# Patient Record
Sex: Male | Born: 1950
Health system: Southern US, Community
[De-identification: ages and names within clinical notes are randomized; demographics above are authoritative.]

## PROBLEM LIST (undated history)

## (undated) HISTORY — PX: HERNIA REPAIR: SHX51

## (undated) HISTORY — PX: CYST EXCISION: SHX5701

---

## 2010-10-02 ENCOUNTER — Encounter
Admission: RE | Admit: 2010-10-02 | Discharge: 2010-10-02 | Payer: Self-pay | Source: Home / Self Care | Attending: Gastroenterology | Admitting: Gastroenterology

## 2012-07-26 IMAGING — RF DG BE W/ AIR HIGH DENSITY
7 of 24 series · 7 of 24 positions shown · IV contrast (agent unspecified)
Comparison: None.

CLINICAL DATA: Incomplete colonoscopy

AIR-CONTRAST BARIUM ENEMA
TECHNIQUE: After obtaining a scout radiograph air-contrast barium
enema was performed under fluoroscopy using high-density barium.
Fluoroscopy Time: 2.6-minute
Contrast: Double contrast barium enema

[Series 1: run · 1 of 1 slices shown (1 of 7)]
[im 1/1]
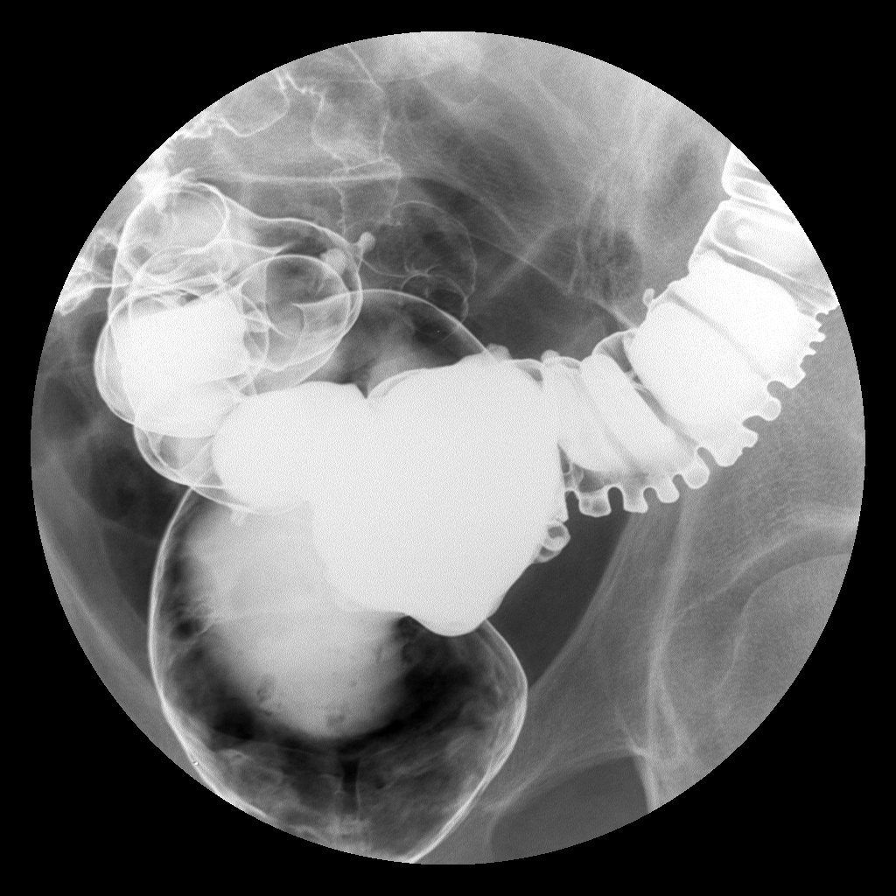

[Series 2: run · 1 of 1 slices shown (2 of 7)]
[im 1/1]
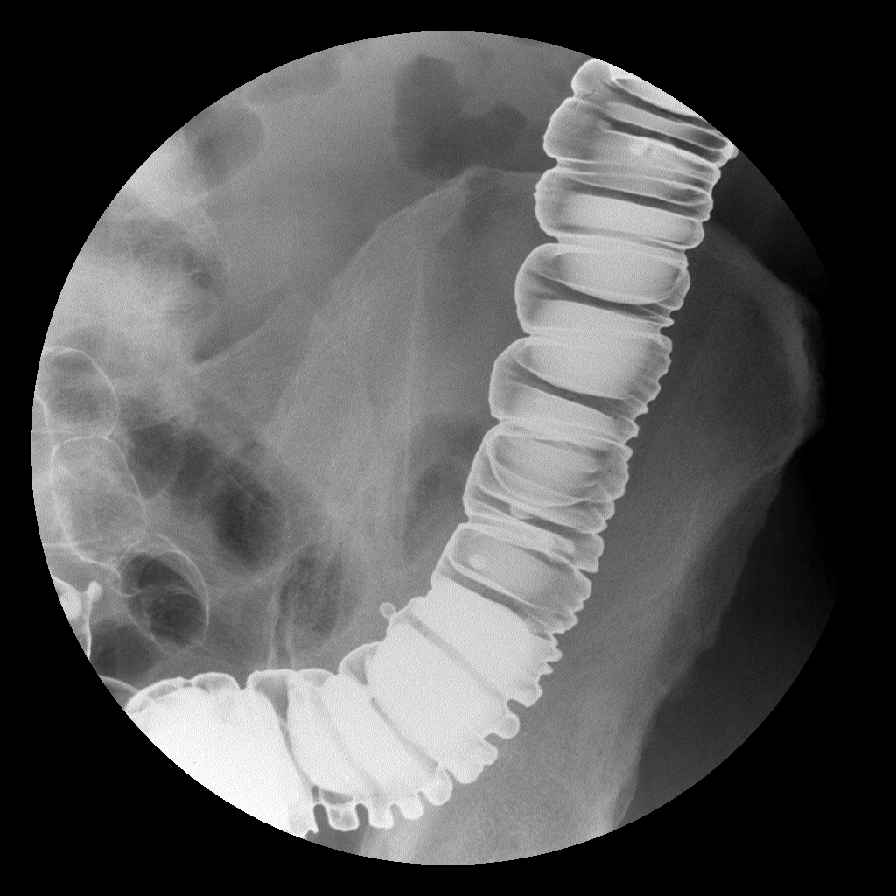

[Series 3: run · 1 of 1 slices shown (3 of 7)]
[im 1/1]
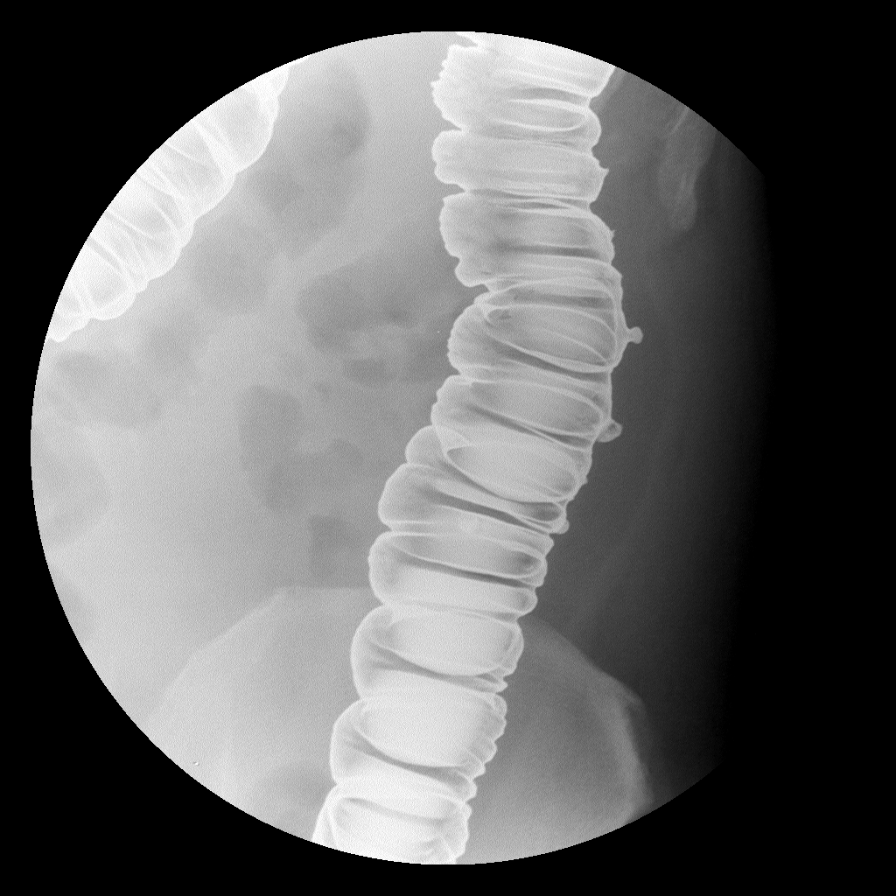

[Series 4: run · 1 of 1 slices shown (4 of 7)]
[im 1/1]
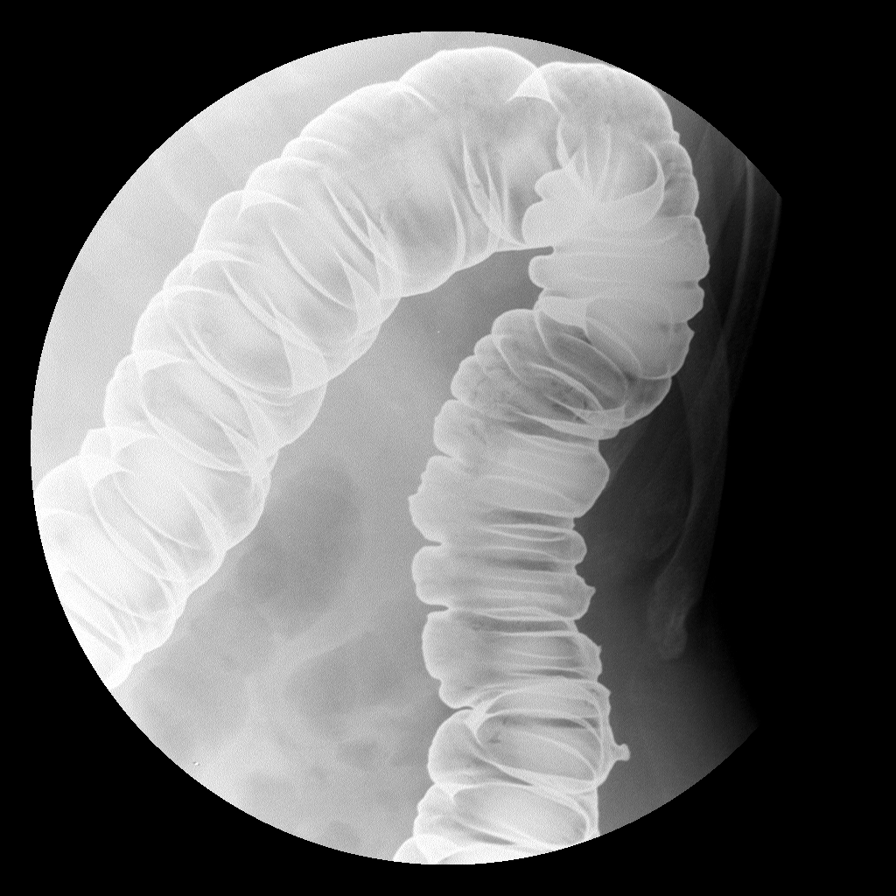

[Series 5: run · 1 of 1 slices shown (5 of 7)]
[im 1/1]
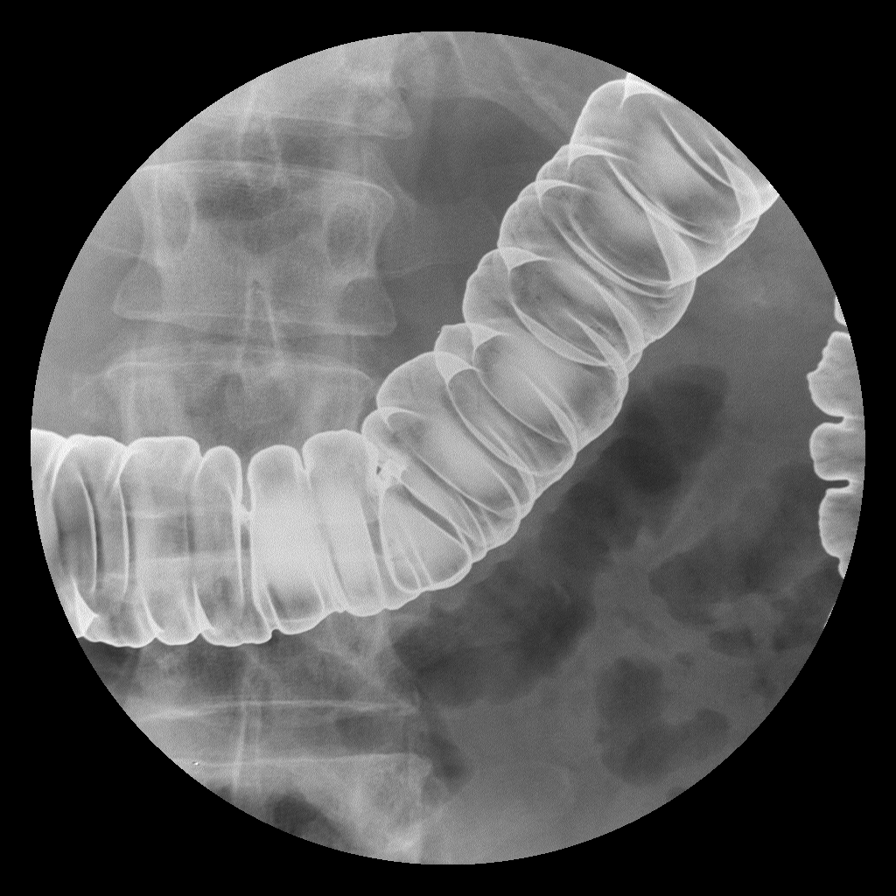

[Series 6: run · 1 of 1 slices shown (6 of 7)]
[im 1/1]
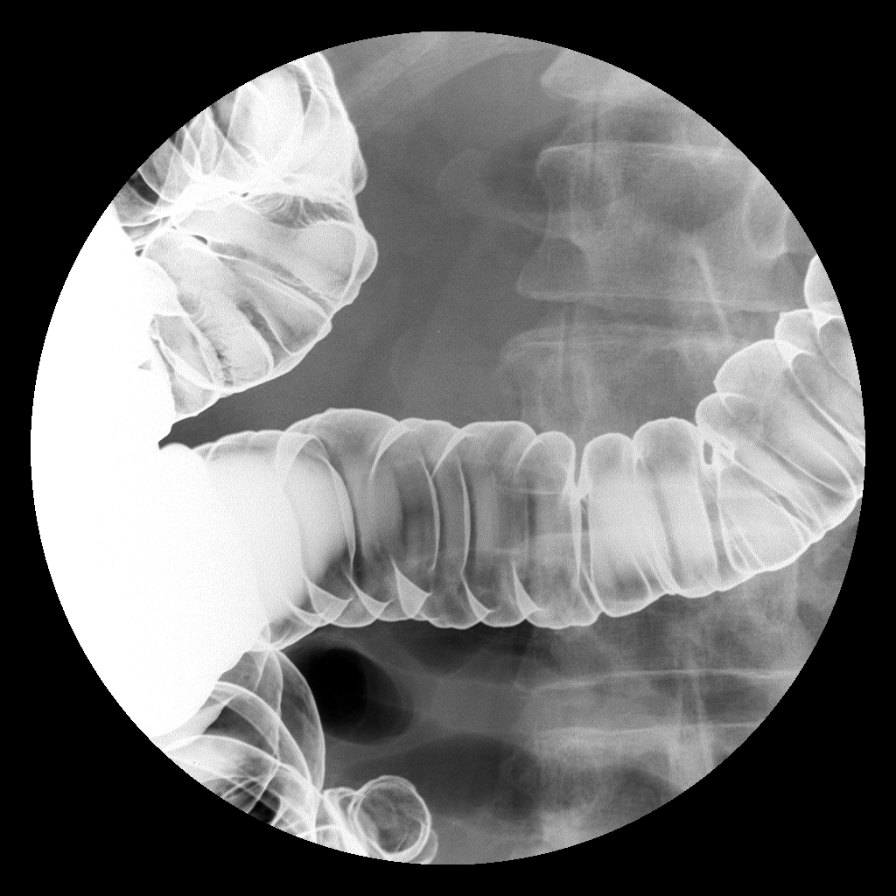

[Series 7: run · 1 of 1 slices shown (7 of 7)]
[im 1/1]
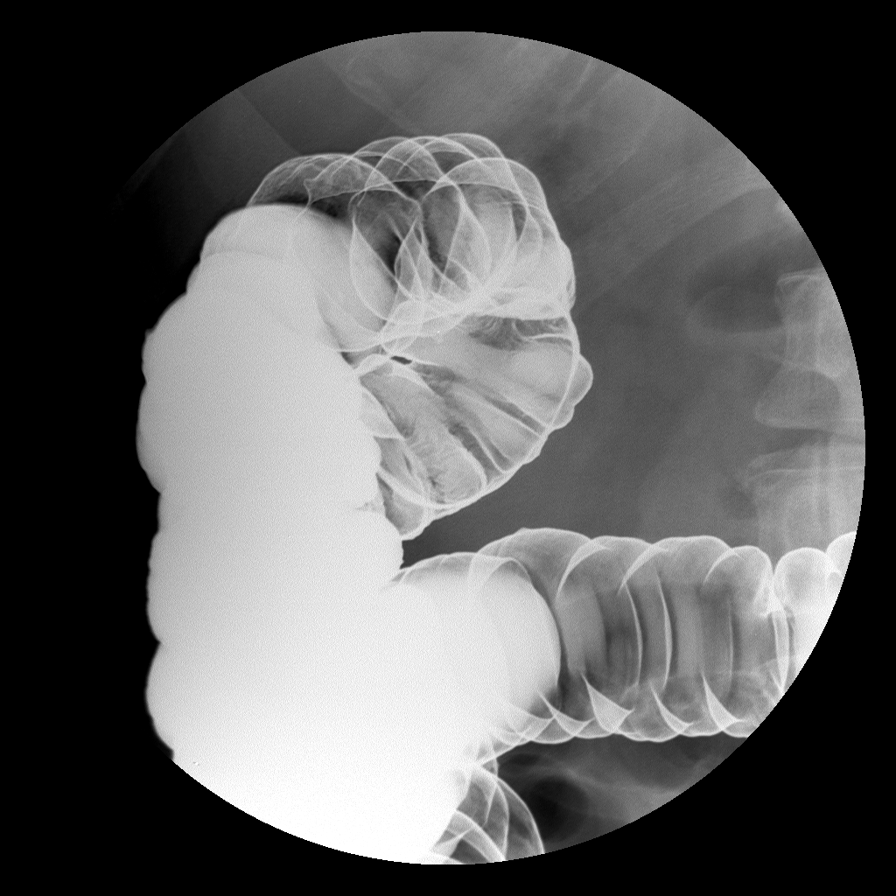

[7 of 24 positions shown; findings below may reference images not displayed]

FINDINGS: A preliminary film of the abdomen shows a nonspecific
bowel gas pattern.  There are degenerative changes in the lumbar
spine.  No opaque calculi are seen.

A double contrast barium enema was performed.  There are
diverticula primarily within the rectosigmoid and distal descending
colon.  A few small diverticula are present in the splenic flexure
of colon as well.  The cecum and terminal ileum appear normal.
IMPRESSION: 1.  Diverticula primarily in the descending and rectosigmoid colon.
2.  No persistent polypoid lesion or constricting lesion.
3.  The terminal ileum appears normal.

## 2013-05-21 ENCOUNTER — Ambulatory Visit (INDEPENDENT_AMBULATORY_CARE_PROVIDER_SITE_OTHER): Payer: PRIVATE HEALTH INSURANCE | Admitting: Internal Medicine

## 2013-05-21 VITALS — BP 102/58 | HR 95 | Temp 98.1°F | Resp 18 | Ht 69.5 in | Wt 197.0 lb

## 2013-05-21 DIAGNOSIS — R52 Pain, unspecified: Secondary | ICD-10-CM

## 2013-05-21 DIAGNOSIS — R6883 Chills (without fever): Secondary | ICD-10-CM

## 2013-05-21 DIAGNOSIS — R319 Hematuria, unspecified: Secondary | ICD-10-CM

## 2013-05-21 LAB — POCT CBC
HCT, POC: 50.7 % (ref 43.5–53.7)
Hemoglobin: 16.9 g/dL (ref 14.1–18.1)
MCH, POC: 32.2 pg — AB (ref 27–31.2)
MCHC: 33.3 g/dL (ref 31.8–35.4)
MPV: 8.7 fL (ref 0–99.8)
POC Granulocyte: 6.8 (ref 2–6.9)
POC MID %: 3.5 %M (ref 0–12)
RDW, POC: 14 %
WBC: 8.2 10*3/uL (ref 4.6–10.2)

## 2013-05-21 LAB — POCT UA - MICROSCOPIC ONLY
Casts, Ur, LPF, POC: NEGATIVE
Crystals, Ur, HPF, POC: NEGATIVE
Mucus, UA: NEGATIVE
Yeast, UA: NEGATIVE

## 2013-05-21 LAB — POCT URINALYSIS DIPSTICK
Leukocytes, UA: NEGATIVE
Spec Grav, UA: 1.015

## 2013-05-21 MED ORDER — DOXYCYCLINE HYCLATE 100 MG PO TABS: 100.0000 mg | ORAL_TABLET | Freq: Two times a day (BID) | ORAL | Status: DC

## 2013-05-21 MED ORDER — CEFTRIAXONE SODIUM 1 G IJ SOLR
1.0000 g | Freq: Once | INTRAMUSCULAR | Status: AC
  Administered 2013-05-21: 1 g via INTRAMUSCULAR

## 2013-05-21 NOTE — Progress Notes (Signed)
Subjective:    Patient ID: Adger Cantera, male    DOB: 02/09/1951, 62 y.o.   MRN: [id number]  HPI CO chills and body aches, no other sxs. Had tick bite 2 weeks ago. No rash or stiff neck, no HA. No urinary sxs. On no meds, no alcohol , drug, or tobacco use. MD/FP is in [facility] in blanket, chilled  Review of Systems Neg.    Objective:   Physical Exam  Vitals reviewed. Constitutional: He is oriented to person, place, and time. He appears well-developed and well-nourished. No distress.  HENT:  Right Ear: External ear normal.  Left Ear: External ear normal.  Nose: Nose normal.  Mouth/Throat: Oropharynx is clear and moist.  Eyes: EOM are normal. Pupils are equal, round, and reactive to light. No scleral icterus.  Neck: Normal range of motion. Neck supple. No thyromegaly present.  Cardiovascular: Normal rate, regular rhythm and normal heart sounds.   Pulmonary/Chest: Effort normal and breath sounds normal.  Abdominal: Soft. Normal appearance. Bowel sounds are decreased. There is no hepatosplenomegaly. There is tenderness. There is CVA tenderness. There is no rebound and no guarding. Hernia confirmed negative in the right inguinal area and confirmed negative in the left inguinal area.    Right flank mild tenderness  Genitourinary: Rectum normal, prostate normal, testes normal and penis normal. No penile erythema or penile tenderness.  Prostate boggy 2+ symmetric, not tender, no nodules  Musculoskeletal: Normal range of motion.  Lymphadenopathy:    He has no cervical adenopathy.       Right: No inguinal adenopathy present.       Left: No inguinal adenopathy present.  Neurological: He is alert and oriented to person, place, and time. He exhibits normal muscle tone.  Skin: No rash noted.  Psychiatric: He has a normal mood and affect. His behavior is normal.   RMSF titer  Results for orders placed in visit on 05/21/13  POCT CBC      Result Value Range   WBC 8.2  4.6 -  10.2 K/uL   Lymph, poc 1.1  0.6 - 3.4   POC LYMPH PERCENT 13.6  10 - 50 %L   MID (cbc) 0.3  0 - 0.9   POC MID % 3.5  0 - 12 %M   POC Granulocyte 6.8  2 - 6.9   Granulocyte percent 82.9 (*) 37 - 80 %G   RBC 5.25  4.69 - 6.13 M/uL   Hemoglobin 16.9  14.1 - 18.1 g/dL   HCT, POC 78.2  95.6 - 53.7 %   MCV 96.6  80 - 97 fL   MCH, POC 32.2 (*) 27 - 31.2 pg   MCHC 33.3  31.8 - 35.4 g/dL   RDW, POC 21.3     Platelet Count, POC 203  142 - 424 K/uL   MPV 8.7  0 - 99.8 fL  POCT URINALYSIS DIPSTICK      Result Value Range   Color, UA yellow     Clarity, UA clear     Glucose, UA neg     Bilirubin, UA neg     Ketones, UA neg     Spec Grav, UA 1.015     Blood, UA mod     pH, UA 6.5     Protein, UA neg     Urobilinogen, UA 0.2     Nitrite, UA neg     Leukocytes, UA Negative    POCT UA - MICROSCOPIC ONLY  Result Value Range   WBC, Ur, HPF, POC 0-3     RBC, urine, microscopic 6-17     Bacteria, U Microscopic trace     Mucus, UA neg     Epithelial cells, urine per micros 0-2     Crystals, Ur, HPF, POC neg     Casts, Ur, LPF, POC neg     Yeast, UA neg     1 po doxy 100mg  now     Assessment & Plan:  Doxy bid/Rocephin 1g RTC tomorrow

## 2013-05-21 NOTE — Progress Notes (Signed)
  Subjective:    Patient ID: Dennis Byrd, male    DOB: 1951-07-12, 62 y.o.   MRN: [id number]  HPIPatient presents with chills and body aches starting last night.    Review of Systems     Objective:   Physical Exam        Assessment & Plan:

## 2013-05-21 NOTE — Patient Instructions (Addendum)
Fever, Adult A fever is a higher than normal body temperature. In an adult, an oral temperature around 98.6 F (37 C) is considered normal. A temperature of 100.4 F (38 C) or higher is generally considered a fever. Mild or moderate fevers generally have no long-term effects and often do not require treatment. Extreme fever (greater than or equal to 106 F or 41.1 C) can cause seizures. The sweating that may occur with repeated or prolonged fever may cause dehydration. Elderly people can develop confusion during a fever. A measured temperature can vary with:  Age.  Time of day.  Method of measurement (mouth, underarm, rectal, or ear). The fever is confirmed by taking a temperature with a thermometer. Temperatures can be taken different ways. Some methods are accurate and some are not.  An oral temperature is used most commonly. Electronic thermometers are fast and accurate.  An ear temperature will only be accurate if the thermometer is positioned as recommended by the manufacturer.  A rectal temperature is accurate and done for those adults who have a condition where an oral temperature cannot be taken.  An underarm (axillary) temperature is not accurate and not recommended. Fever is a symptom, not a disease.  CAUSES   Infections commonly cause fever.  Some noninfectious causes for fever include:  Some arthritis conditions.  Some thyroid or adrenal gland conditions.  Some immune system conditions.  Some types of cancer.  A medicine reaction.  High doses of certain street drugs such as methamphetamine.  Dehydration.  Exposure to high outside or room temperatures.  Occasionally, the source of a fever cannot be determined. This is sometimes called a "fever of unknown origin" (FUO).  Some situations may lead to a temporary rise in body temperature that may go away on its own. Examples are:  Childbirth.  Surgery.  Intense exercise. HOME CARE INSTRUCTIONS   Take  appropriate medicines for fever. Follow dosing instructions carefully. If you use acetaminophen to reduce the fever, be careful to avoid taking other medicines that also contain acetaminophen. Do not take aspirin for a fever if you are younger than age 19. There is an association with Reye's syndrome. Reye's syndrome is a rare but potentially deadly disease.  If an infection is present and antibiotics have been prescribed, take them as directed. Finish them even if you start to feel better.  Rest as needed.  Maintain an adequate fluid intake. To prevent dehydration during an illness with prolonged or recurrent fever, you may need to drink extra fluid.Drink enough fluids to keep your urine clear or pale yellow.  Sponging or bathing with room temperature water may help reduce body temperature. Do not use ice water or alcohol sponge baths.  Dress comfortably, but do not over-bundle. SEEK MEDICAL CARE IF:   You are unable to keep fluids down.  You develop vomiting or diarrhea.  You are not feeling at least partly better after 3 days.  You develop new symptoms or problems. SEEK IMMEDIATE MEDICAL CARE IF:   You have shortness of breath or trouble breathing.  You develop excessive weakness.  You are dizzy or you faint.  You are extremely thirsty or you are making little or no urine.  You develop new pain that was not there before (such as in the head, neck, chest, back, or abdomen).  You have persistant vomiting and diarrhea for more than 1 to 2 days.  You develop a stiff neck or your eyes become sensitive to light.  You develop a   skin rash.  You have a fever or persistent symptoms for more than 2 to 3 days.  You have a fever and your symptoms suddenly get worse. MAKE SURE YOU:   Understand these instructions.  Will watch your condition.  Will get help right away if you are not doing well or get worse. Document Released: 04/03/2001 Document Revised: 12/31/2011 Document  Reviewed: 08/09/2011 [facility] Patient Information 2014 [place], [place]. Wood Tick Bite Ticks are insects that attach themselves to the skin. Most tick bites are harmless, but sometimes ticks carry diseases that can make a person quite ill. The chance of getting ill depends on:  The kind of tick that bites you.  Time of year.  How long the tick is attached.  Geographic location. Wood ticks are also called dog ticks. They are generally black. They can have white markings. They live in shrubs and grassy areas. They are larger than deer ticks. Wood ticks are about the size of a watermelon seed. They have a hard body. The most common places for ticks to attach themselves are the scalp, neck, armpits, waist, and groin. Wood ticks may stay attached for up to 2 weeks. TICKS MUST BE REMOVED AS SOON AS POSSIBLE TO HELP PREVENT DISEASES CAUSED BY TICK BITES.  TO REMOVE A TICK: 1. If available, put on latex gloves before trying to remove a tick. 2. Grasp the tick as close to the skin as possible, with curved forceps, fine tweezers or a special tick removal tool. 3. Pull gently with steady pressure until the tick lets go. Do not twist the tick or jerk it suddenly. This may break off the tick's head or mouth parts. 4. Do not crush the tick's body. This could force disease-carrying fluids from the tick into your body. 5. After the tick is removed, wash the bite area and your hands with soap and water or other disinfectant. 6. Apply a small amount of antiseptic cream or ointment to the bite site. 7. Wash and disinfect any instruments that were used. 8. Save the tick in a jar or plastic bag for later identification. Preserve the tick with a bit of alcohol or put it in the freezer. 9. Do not apply a hot match, petroleum jelly, or fingernail polish to the tick. This does not work and may increase the chances of disease from the tick bite. YOU MAY NEED TO SEE YOUR CAREGIVER FOR A TETANUS SHOT NOW IF:  You  have no idea when you had the last one.  You have never had a tetanus shot before. If you need a tetanus shot, and you decide not to get one, there is a rare chance of getting tetanus. Sickness from tetanus can be serious. If you get a tetanus shot, your arm may swell, get red and warm to the touch at the shot site. This is common and not a problem. PREVENTION  Wear protective clothing. Long sleeves and pants are best.  Wear white clothes to see ticks more easily  Tuck your pant legs into your socks.  If walking on trail, stay in the middle of the trail to avoid brushing against bushes.  Put insect repellent on all exposed skin and along boot tops, pant legs and sleeve cuffs  Check clothing, hair and skin repeatedly and before coming inside.  Brush off any ticks that are not attached. SEEK MEDICAL CARE IF:   You cannot remove a tick or part of the tick that is left in the skin.  Unexplained  fever.  Redness and swelling in the area of the tick bite.  Tender, swollen lymph glands.  Diarrhea.  Weight loss.  Cough.  Fatigue.  Muscle, joint or bone pain.  Belly pain.  Headache.  Rash. SEEK IMMEDIATE MEDICAL CARE IF:   You develop an oral temperature above 102 F (38.9 C).  You are having trouble walking or moving your legs.  Numbness in the legs.  Shortness of breath.  Confusion.  Repeated vomiting. Document Released: 10/05/2000 Document Revised: 12/31/2011 Document Reviewed: 09/13/2008 [facility] Patient Information 2014 [place], [place]. Urinary Tract Infection Urinary tract infections (UTIs) can develop anywhere along your urinary tract. Your urinary tract is your body's drainage system for removing wastes and extra water. Your urinary tract includes two kidneys, two ureters, a bladder, and a urethra. Your kidneys are a pair of bean-shaped organs. Each kidney is about the size of your fist. They are located below your ribs, one on each side of your  spine. CAUSES Infections are caused by microbes, which are microscopic organisms, including fungi, viruses, and bacteria. These organisms are so small that they can only be seen through a microscope. Bacteria are the microbes that most commonly cause UTIs. SYMPTOMS  Symptoms of UTIs may vary by age and gender of the patient and by the location of the infection. Symptoms in young women typically include a frequent and intense urge to urinate and a painful, burning feeling in the bladder or urethra during urination. Older women and men are more likely to be tired, shaky, and weak and have muscle aches and abdominal pain. A fever may mean the infection is in your kidneys. Other symptoms of a kidney infection include pain in your back or sides below the ribs, nausea, and vomiting. DIAGNOSIS To diagnose a UTI, your caregiver will ask you about your symptoms. Your caregiver also will ask to provide a urine sample. The urine sample will be tested for bacteria and white blood cells. White blood cells are made by your body to help fight infection. TREATMENT  Typically, UTIs can be treated with medication. Because most UTIs are caused by a bacterial infection, they usually can be treated with the use of antibiotics. The choice of antibiotic and length of treatment depend on your symptoms and the type of bacteria causing your infection. HOME CARE INSTRUCTIONS  If you were prescribed antibiotics, take them exactly as your caregiver instructs you. Finish the medication even if you feel better after you have only taken some of the medication.  Drink enough water and fluids to keep your urine clear or pale yellow.  Avoid caffeine, tea, and carbonated beverages. They tend to irritate your bladder.  Empty your bladder often. Avoid holding urine for long periods of time.  Empty your bladder before and after sexual intercourse.  After a bowel movement, women should cleanse from front to back. Use each tissue only  once. SEEK MEDICAL CARE IF:   You have back pain.  You develop a fever.  Your symptoms do not begin to resolve within 3 days. SEEK IMMEDIATE MEDICAL CARE IF:   You have severe back pain or lower abdominal pain.  You develop chills.  You have nausea or vomiting.  You have continued burning or discomfort with urination. MAKE SURE YOU:   Understand these instructions.  Will watch your condition.  Will get help right away if you are not doing well or get worse. Document Released: 07/18/2005 Document Revised: 04/08/2012 Document Reviewed: 11/16/2011 [facility] Patient Information 2014 [place], [place].

## 2013-05-22 ENCOUNTER — Ambulatory Visit (INDEPENDENT_AMBULATORY_CARE_PROVIDER_SITE_OTHER): Payer: PRIVATE HEALTH INSURANCE | Admitting: Internal Medicine

## 2013-05-22 VITALS — BP 108/64 | HR 90 | Temp 97.3°F | Resp 18 | Ht 69.5 in | Wt 197.0 lb

## 2013-05-22 DIAGNOSIS — R319 Hematuria, unspecified: Secondary | ICD-10-CM

## 2013-05-22 DIAGNOSIS — R6883 Chills (without fever): Secondary | ICD-10-CM

## 2013-05-22 MED ORDER — CEFTRIAXONE SODIUM 1 G IJ SOLR
1.0000 g | Freq: Once | INTRAMUSCULAR | Status: AC
  Administered 2013-05-22: 1 g via INTRAMUSCULAR

## 2013-05-22 NOTE — Progress Notes (Signed)
  Subjective:    Patient ID: Dennis Byrd, male    DOB: 1950/11/02, 62 y.o.   MRN: [id number]  HPI Improved a lot. Diaphoresis and chills resolved 3am. Right flank pain almost gone, no urinary sxs. Must reck urine 2 days  Review of Systems neg    Objective:   Physical Exam  Vitals reviewed. Constitutional: He is oriented to person, place, and time. He appears well-developed and well-nourished. No distress.  Neck: Neck supple.  Cardiovascular: Normal rate.   Pulmonary/Chest: Effort normal.  Abdominal: Soft. There is no tenderness.  Genitourinary: Penis normal.  Neurological: He is alert and oriented to person, place, and time.  Skin: No rash noted.  Psychiatric: He has a normal mood and affect.          Assessment & Plan:  Rocephin 1g for likely pyelonephritis RTC Sunday for [facility] 8am

## 2013-05-23 LAB — URINE CULTURE
Colony Count: NO GROWTH
Organism ID, Bacteria: NO GROWTH

## 2013-05-26 ENCOUNTER — Ambulatory Visit (INDEPENDENT_AMBULATORY_CARE_PROVIDER_SITE_OTHER): Payer: PRIVATE HEALTH INSURANCE | Admitting: Internal Medicine

## 2013-05-26 ENCOUNTER — Encounter: Payer: Self-pay | Admitting: Family Medicine

## 2013-05-26 VITALS — BP 138/72 | HR 75 | Temp 97.7°F | Resp 18 | Ht 70.0 in | Wt 198.0 lb

## 2013-05-26 DIAGNOSIS — R972 Elevated prostate specific antigen [PSA]: Secondary | ICD-10-CM

## 2013-05-26 DIAGNOSIS — N390 Urinary tract infection, site not specified: Secondary | ICD-10-CM

## 2013-05-26 LAB — COMPREHENSIVE METABOLIC PANEL
ALT: 212 U/L — ABNORMAL HIGH (ref 0–53)
Albumin: 4 g/dL (ref 3.5–5.2)
Alkaline Phosphatase: 354 U/L — ABNORMAL HIGH (ref 39–117)
BUN: 16 mg/dL (ref 6–23)
CO2: 28 mEq/L (ref 19–32)
Glucose, Bld: 136 mg/dL — ABNORMAL HIGH (ref 70–99)
Potassium: 4.4 mEq/L (ref 3.5–5.3)

## 2013-05-26 LAB — POCT URINALYSIS DIPSTICK
Bilirubin, UA: NEGATIVE
Glucose, UA: NEGATIVE
Ketones, UA: NEGATIVE
Protein, UA: NEGATIVE
Spec Grav, UA: 1.01
Urobilinogen, UA: 0.2
pH, UA: 7

## 2013-05-26 LAB — LIPID PANEL
LDL Cholesterol: 71 mg/dL (ref 0–99)
Total CHOL/HDL Ratio: 4.7 Ratio
VLDL: 39 mg/dL (ref 0–40)

## 2013-05-26 LAB — POCT UA - MICROSCOPIC ONLY: Yeast, UA: NEGATIVE

## 2013-05-26 LAB — PSA: PSA: 3.48 ng/mL (ref <=4.00)

## 2013-05-26 NOTE — Patient Instructions (Addendum)
Immunization Schedule, Adult  Influenza vaccine.  Adults should be given 1 dose every year.  Tetanus, diphtheria, and pertussis (Td, Tdap) vaccine.  Adults who have not previously been given Tdap or who do not know their vaccine status should be given 1 dose of Tdap.  Adults should have a Td booster every 10 years.  Doses should be given if needed to catch up on missed doses in the past.  Pregnant women should be given 1 dose of Tdap vaccine during each pregnancy.  Varicella vaccine.  All adults without evidence of immunity to varicella should receive 2 doses or a second dose if they have received only 1 dose.  Pregnant women who do not have evidence of immunity should be given the first dose after their pregnancy.  Human papillomavirus (HPV) vaccine.  Women aged 13 through 26 years who have not been given the vaccine previously should be given the 3 dose series. The second dose should be given 1 to 2 months after the first dose. The third dose should be given at least 24 weeks after the first dose.  The vaccine is not recommended for use in pregnant women. However, pregnancy testing is not needed before being given a dose. If a woman is found to be pregnant after being given a dose, no treatment is needed. In that case, the remaining doses should be delayed until after the pregnancy.  Men aged 13 through 21 years who have not been given the vaccine previously should be given the 3 dose series. Men aged 22 through 26 years may be given the 3 dose series. The second dose should be given 1 to 2 months after the first dose. The third dose should be given at least 24 weeks after the first dose.  Zoster vaccine.  One dose is recommended for adults aged 60 years and older unless certain conditions are present.  Measles, mumps, and rubella (MMR) vaccine.  Adults born before 1957 generally are considered immune to measles and mumps. Healthcare workers born before 1957 who do not have  evidence of immunity should consider vaccination.  Adults born in 1957 or later should have 1 or more doses of MMR vaccine unless there is a contraindication for the vaccine or they have evidence of immunity to the diseases. A second dose should be given at least 28 days after the first dose. Adults receiving certain types of previous vaccines should consider or be given vaccine doses.  For women of childbearing age, rubella immunity should be determined. If there is no evidence of immunity, women who are not pregnant should be vaccinated. If there is no evidence of immunity, women who are pregnant should delay vaccination until after their pregnancy.  Pneumococcal polysaccharide (PPSV23) vaccine.  All adults aged 65 years and older should be given 1 dose.  Adults younger than age 65 years who have certain medical conditions, who smoke cigarettes, who reside in nursing homes or long-term care facilities, or who have an unknown vaccination history should usually be given 1 or 2 doses of the vaccine.  Pneumococcal 13-valent conjugate (PVC13) vaccine.  Adults aged 19 years or older with certain medical conditions and an unknown or incomplete pneumococcal vaccination history should usually be given 1 dose of the vaccine. This dose may be in addition to a PPSV23 vaccine dose.  Meningococcal vaccine.  First-year college students up to age 21 years who are living in residence halls should be given a dose if they did not receive a dose on   or after their 16th birthday.  A dose should be given to microbiologists working with certain meningitis bacteria, military recruits, and people who travel to or live in countries with a high rate of meningitis.  One or 2 doses should be given to adults who have certain high-risk conditions.  Hepatitis A vaccine.  Adults who wish to be protected from this disease, who have certain high-risk conditions, who work with hepatitis A-infected animals, who work in  hepatitis A research labs, or who travel to or work in countries with a high rate of hepatitis A should be given the 2 dose series of the vaccine.  Adults who were previously unvaccinated and who anticipate close contact with an international adoptee during the first 60 days after arrival in the [place] from a country with a high rate of hepatitis A should be given the vaccine. The first dose of the 2 dose series should be given 2 or more weeks before the arrival of the adoptee.  Hepatitis B vaccine.  Adults who wish to be protected from this disease, who have certain high-risk conditions, who may be exposed to blood or other infectious body fluids, who are household contacts or sex partners of hepatitis B positive people, who are clients or workers in certain care facilities, or who travel to or work in countries with a high rate of hepatitis B should be given the 3 dose series of the vaccine. If you travel outside the [place], additional vaccines may be needed. The Centers for Disease Control and Prevention (CDC) provides information about the vaccines, medicines, and other measures necessary to prevent illness and injury during international travel. Visit the CDC website at www.cdc.gov/travel or call (800) CDC-INFO [800-232-4636]. You may also consult a travel clinic or your caregiver. Document Released: 12/29/2003 Document Revised: 12/31/2011 Document Reviewed: 11/23/2011 [employer] Patient Information 2014 [employer].  

## 2013-05-26 NOTE — Progress Notes (Signed)
  Subjective:    Patient ID: Dennis Byrd, male    DOB: 06/22/1951, 62 y.o.   MRN: [id number]  HPI F/up uti and elevated PSA. Has done well. Still fatigue at end of the day. No fever,chills. PSA was 3.58, needs repeat today, probably caused by infection.   Review of Systems neg    Objective:   Physical Exam  Vitals reviewed. Constitutional: He is oriented to person, place, and time. He appears well-developed and well-nourished. No distress.  Eyes: EOM are normal.  Cardiovascular: Normal rate, regular rhythm and normal heart sounds.   Pulmonary/Chest: Effort normal and breath sounds normal.  Abdominal: There is no tenderness.  Neurological: He is alert and oriented to person, place, and time. He exhibits normal muscle tone. Coordination normal.  Psychiatric: He has a normal mood and affect.     Results for orders placed in visit on 05/26/13  POCT UA - MICROSCOPIC ONLY      Result Value Range   WBC, Ur, HPF, POC neg     RBC, urine, microscopic 0-2     Bacteria, U Microscopic neg     Mucus, UA neg     Epithelial cells, urine per micros 0-1     Crystals, Ur, HPF, POC neg     Casts, Ur, LPF, POC neg     Yeast, UA neg    POCT URINALYSIS DIPSTICK      Result Value Range   Color, UA yellow     Clarity, UA clear     Glucose, UA neg     Bilirubin, UA neg     Ketones, UA neg     Spec Grav, UA 1.010     Blood, UA small     pH, UA 7.0     Protein, UA neg     Urobilinogen, UA 0.2     Nitrite, UA neg     Leukocytes, UA Negative     Cmet/psa/lipids done     Assessment & Plan:  Resolving UTI/?prostatitis Finish doxy

## 2013-05-29 ENCOUNTER — Encounter: Payer: Self-pay | Admitting: Family Medicine

## 2016-05-04 ENCOUNTER — Encounter: Payer: Self-pay | Admitting: Physician Assistant

## 2016-05-04 ENCOUNTER — Ambulatory Visit: Payer: Medicare Other | Admitting: Physician Assistant

## 2016-05-04 VITALS — BP 122/72 | HR 72 | Temp 97.8°F | Resp 17 | Ht 69.5 in | Wt 203.0 lb

## 2016-05-04 DIAGNOSIS — Z131 Encounter for screening for diabetes mellitus: Secondary | ICD-10-CM

## 2016-05-04 DIAGNOSIS — M5441 Lumbago with sciatica, right side: Secondary | ICD-10-CM | POA: Diagnosis not present

## 2016-05-04 LAB — GLUCOSE, POCT (MANUAL RESULT ENTRY): POC Glucose: 105 mg/dL — AB (ref 70–99)

## 2016-05-04 MED ORDER — CYCLOBENZAPRINE HCL 10 MG PO TABS: 5.0000 mg | ORAL_TABLET | Freq: Every day | ORAL | Status: DC

## 2016-05-04 MED ORDER — PREDNISONE 20 MG PO TABS: ORAL_TABLET | ORAL | Status: AC

## 2016-05-04 NOTE — Patient Instructions (Signed)
     IF you received an x-ray today, you will receive an invoice from [facility]. Please contact [facility] at [phone] with questions or concerns regarding your invoice.   IF you received labwork today, you will receive an invoice from [facility]. Please contact [facility] at [phone] with questions or concerns regarding your invoice.   Our billing staff will not be able to assist you with questions regarding bills from these companies.  You will be contacted with the lab results as soon as they are available. The fastest way to get your results is to activate your My Chart account. Instructions are located on the last page of this paperwork. If you have not heard from us regarding the results in 2 weeks, please contact this office.      

## 2016-05-04 NOTE — Progress Notes (Signed)
05/04/2016 9:12 AM   DOB: [birth year] / MRN: [id number]  SUBJECTIVE:  Dennis Byrd is a 65 y.o. male presenting for right sided low back pain and he associates right sided calf pain that he describes as a toothache.  Reports this started last week after doing some yard work and he felt a twinge in his back.  The calf pain started 4 days ago.  He denies leg swelling, SOB, chest pian, and cough.  He does not smoke and never has.   He has No Known Allergies.   He  has no past medical history on file.    He  reports that he has never smoked. He does not have any smokeless tobacco history on file. He  has no sexual activity history on file. The patient  has no past surgical history on file.  His family history is not on file.  Review of Systems  Constitutional: Negative for fever and chills.  Gastrointestinal: Negative for nausea.  Musculoskeletal: Positive for back pain. Negative for myalgias, joint pain, falls and neck pain.  Skin: Negative for itching and rash.  Neurological: Negative for dizziness, sensory change and headaches.    Problem list and medications reviewed and updated by myself where necessary, and exist elsewhere in the encounter.   OBJECTIVE:  BP 122/72 mmHg  Pulse 72  Temp(Src) 97.8 F (36.6 C) (Oral)  Resp 17  Ht 5' 9.5" (1.765 m)  Wt 203 lb (92.08 kg)  BMI 29.56 kg/m2  SpO2 97%  Physical Exam  Constitutional: He is oriented to person, place, and time. He appears well-developed. He does not appear ill.  Eyes: Conjunctivae and EOM are normal. Pupils are equal, round, and reactive to light.  Cardiovascular: Normal rate.   Pulmonary/Chest: Effort normal.  Abdominal: He exhibits no distension.  Musculoskeletal: Normal range of motion.       Lumbar back: He exhibits pain and spasm. He exhibits normal range of motion and no bony tenderness.       Right lower leg: He exhibits no swelling and no edema.       Left lower leg: He exhibits no swelling and no edema.      Right foot: There is no swelling.       Left foot: There is no swelling.  Neurological: He is alert and oriented to person, place, and time. He has normal strength and normal reflexes. He displays no atrophy and no tremor. No cranial nerve deficit or sensory deficit. He exhibits normal muscle tone. He displays no seizure activity. Coordination and gait normal. GCS eye subscore is 4. GCS verbal subscore is 5. GCS motor subscore is 6.  Postive SLR on right side.   Skin: Skin is warm and dry. He is not diaphoretic.  Psychiatric: He has a normal mood and affect.  Nursing note and vitals reviewed.   Results for orders placed or performed in visit on 05/04/16 (from the past 72 hour(s))  POCT glucose (manual entry)     Status: Abnormal   Collection Time: 05/04/16  9:07 AM  Result Value Ref Range   POC Glucose 105 (A) 70 - 99 mg/dl    No results found.  ASSESSMENT AND PLAN  [name] was seen today for leg pain.  Diagnoses and all orders for this visit:  Right-sided low back pain with right-sided sciatica: Given nerve involvement will go ahead and start pred.  Flexeril as needed qhs.  RTC in 10-15 days if not improved for rads.  -  POCT glucose (manual entry)  Screening for diabetes mellitus (DM) -     POCT glucose (manual entry)    The patient was advised to call or return to clinic if he does not see an improvement in symptoms, or to seek the care of the closest emergency department if he worsens with the above plan.   Philis Fendt, MHS, PA-C [facility] 05/04/2016 9:12 AM

## 2016-05-09 ENCOUNTER — Telehealth: Payer: Self-pay

## 2016-05-09 NOTE — Telephone Encounter (Signed)
I spoke with him and all questions answered.

## 2016-05-09 NOTE — Telephone Encounter (Signed)
Okay to stop the muscle relaxor. Philis Fendt, MS, PA-C 12:47 PM, 05/09/2016

## 2016-05-09 NOTE — Telephone Encounter (Signed)
Patient called back and stated that he told them this morning when he called in that he is going out of town tomorrow around [employer], he states that he is almost done with the pills but his leg is still sore and that he was told that by the end of his pills the soreness would be gone, he is fine with not taking the muscle relaxer anymore but he wants to know if he needs to come back in and be seen again since his leg is still sore and he is almost out of his meds.   Can someone call him back ASAP since his message was not put in correctly the first time.  His call back number is [phone]

## 2016-05-09 NOTE — Telephone Encounter (Signed)
Patient is 50% better, does patient need to come back in.   Took muscle relaxer first night was okay.  Second night keep him awake all night.   [phone]

## 2018-05-15 ENCOUNTER — Ambulatory Visit: Payer: Self-pay | Admitting: Family Medicine

## 2018-05-15 ENCOUNTER — Encounter: Payer: Self-pay | Admitting: Family Medicine

## 2018-05-15 VITALS — BP 137/80 | HR 68 | Ht 70.0 in | Wt 205.0 lb

## 2018-05-15 DIAGNOSIS — Z029 Encounter for administrative examinations, unspecified: Secondary | ICD-10-CM

## 2018-05-15 DIAGNOSIS — Z0289 Encounter for other administrative examinations: Secondary | ICD-10-CM

## 2018-05-15 NOTE — Progress Notes (Signed)
   BP 137/80   Pulse 68   Ht 5\' 10"  (1.778 m)   Wt 205 lb (93 kg)   BMI 29.41 kg/m    Subjective:    Patient ID: Dennis Byrd, male    DOB: 1950/10/28, 67 y.o.   MRN: [id number]  HPI: Dennis Byrd is a 67 y.o. male  Chief Complaint  Patient presents with  . FAA    Class 3     Relevant past medical, surgical, family and social history reviewed and updated as indicated. Interim medical history since our last visit reviewed. Allergies and medications reviewed and updated.  Review of Systems  Constitutional: Negative.   HENT: Negative.   Eyes: Negative.   Respiratory: Negative.   Cardiovascular: Negative.   Gastrointestinal: Negative.   Endocrine: Negative.   Genitourinary: Negative.   Musculoskeletal: Negative.   Skin: Negative.   Allergic/Immunologic: Negative.   Neurological: Negative.   Hematological: Negative.   Psychiatric/Behavioral: Negative.     Per HPI unless specifically indicated above     Objective:    BP 137/80   Pulse 68   Ht 5\' 10"  (1.778 m)   Wt 205 lb (93 kg)   BMI 29.41 kg/m   Wt Readings from Last 3 Encounters:  05/15/18 205 lb (93 kg)  05/04/16 203 lb (92.1 kg)  05/26/13 198 lb (89.8 kg)    Physical Exam  Constitutional: He is oriented to person, place, and time. He appears well-developed and well-nourished.  HENT:  Head: Normocephalic.  Right Ear: External ear normal.  Left Ear: External ear normal.  Nose: Nose normal.  Eyes: Pupils are equal, round, and reactive to light. Conjunctivae and EOM are normal.  Neck: Normal range of motion. Neck supple. No thyromegaly present.  Cardiovascular: Normal rate, regular rhythm, normal heart sounds and intact distal pulses.  Pulmonary/Chest: Effort normal and breath sounds normal.  Abdominal: Soft. Bowel sounds are normal. There is no splenomegaly or hepatomegaly.  Genitourinary: Penis normal.  Musculoskeletal: Normal range of motion.  Lymphadenopathy:    He has no cervical adenopathy.    Neurological: He is alert and oriented to person, place, and time. He has normal reflexes.  Skin: Skin is warm and dry.  Psychiatric: He has a normal mood and affect. His behavior is normal. Judgment and thought content normal.    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

## 2019-03-31 DIAGNOSIS — D229 Melanocytic nevi, unspecified: Secondary | ICD-10-CM | POA: Diagnosis not present

## 2019-03-31 DIAGNOSIS — L570 Actinic keratosis: Secondary | ICD-10-CM | POA: Diagnosis not present

## 2019-03-31 DIAGNOSIS — L814 Other melanin hyperpigmentation: Secondary | ICD-10-CM | POA: Diagnosis not present

## 2019-03-31 DIAGNOSIS — D1801 Hemangioma of skin and subcutaneous tissue: Secondary | ICD-10-CM | POA: Diagnosis not present

## 2019-03-31 DIAGNOSIS — L819 Disorder of pigmentation, unspecified: Secondary | ICD-10-CM | POA: Diagnosis not present

## 2019-03-31 DIAGNOSIS — L820 Inflamed seborrheic keratosis: Secondary | ICD-10-CM | POA: Diagnosis not present

## 2019-03-31 DIAGNOSIS — D485 Neoplasm of uncertain behavior of skin: Secondary | ICD-10-CM | POA: Diagnosis not present

## 2020-01-05 DIAGNOSIS — R3912 Poor urinary stream: Secondary | ICD-10-CM | POA: Diagnosis not present

## 2020-04-12 DIAGNOSIS — D229 Melanocytic nevi, unspecified: Secondary | ICD-10-CM | POA: Diagnosis not present

## 2020-04-12 DIAGNOSIS — L819 Disorder of pigmentation, unspecified: Secondary | ICD-10-CM | POA: Diagnosis not present

## 2020-04-12 DIAGNOSIS — L821 Other seborrheic keratosis: Secondary | ICD-10-CM | POA: Diagnosis not present

## 2020-04-12 DIAGNOSIS — L820 Inflamed seborrheic keratosis: Secondary | ICD-10-CM | POA: Diagnosis not present

## 2020-04-12 DIAGNOSIS — M795 Residual foreign body in soft tissue: Secondary | ICD-10-CM | POA: Diagnosis not present

## 2020-11-01 DIAGNOSIS — Z20822 Contact with and (suspected) exposure to covid-19: Secondary | ICD-10-CM | POA: Diagnosis not present

## 2020-11-01 DIAGNOSIS — U071 COVID-19: Secondary | ICD-10-CM | POA: Diagnosis not present

## 2020-11-01 DIAGNOSIS — Z1152 Encounter for screening for COVID-19: Secondary | ICD-10-CM | POA: Diagnosis not present

## 2021-01-17 DIAGNOSIS — R3912 Poor urinary stream: Secondary | ICD-10-CM | POA: Diagnosis not present

## 2021-01-31 DIAGNOSIS — R300 Dysuria: Secondary | ICD-10-CM | POA: Diagnosis not present

## 2021-05-18 ENCOUNTER — Ambulatory Visit (INDEPENDENT_AMBULATORY_CARE_PROVIDER_SITE_OTHER): Payer: Medicare Other | Admitting: Internal Medicine

## 2021-05-18 ENCOUNTER — Other Ambulatory Visit: Payer: Self-pay

## 2021-05-18 ENCOUNTER — Encounter: Payer: Self-pay | Admitting: Internal Medicine

## 2021-05-18 VITALS — BP 137/85 | HR 71 | Temp 98.5°F | Ht 70.87 in | Wt 201.8 lb

## 2021-05-18 DIAGNOSIS — R7989 Other specified abnormal findings of blood chemistry: Secondary | ICD-10-CM

## 2021-05-18 DIAGNOSIS — E785 Hyperlipidemia, unspecified: Secondary | ICD-10-CM

## 2021-05-18 LAB — URINALYSIS, ROUTINE W REFLEX MICROSCOPIC
Bilirubin, UA: NEGATIVE
Glucose, UA: NEGATIVE
Ketones, UA: NEGATIVE
Nitrite, UA: NEGATIVE
Protein,UA: NEGATIVE
Specific Gravity, UA: 1.01 (ref 1.005–1.030)
Urobilinogen, Ur: 0.2 mg/dL (ref 0.2–1.0)
pH, UA: 6.5 (ref 5.0–7.5)

## 2021-05-18 LAB — MICROSCOPIC EXAMINATION
Bacteria, UA: NONE SEEN
Epithelial Cells (non renal): NONE SEEN /hpf (ref 0–10)

## 2021-05-18 MED ORDER — CARBAMIDE PEROXIDE 6.5 % OT SOLN: 5.0000 [drp] | Freq: Two times a day (BID) | OTIC | 0 refills | Status: DC

## 2021-05-18 NOTE — Patient Instructions (Signed)
Earwax Buildup, Adult ?The ears produce a substance called earwax that helps keep bacteria out of the ear and protects the skin in the ear canal. Occasionally, earwax can build up in the ear and cause discomfort or hearing loss. ?What are the causes? ?This condition is caused by a buildup of earwax. Ear canals are self-cleaning. Ear wax is made in the outer part of the ear canal and generally falls out in small amounts over time. ?When the self-cleaning mechanism is not working, earwax builds up and can cause decreased hearing and discomfort. Attempting to clean ears with cotton swabs can push the earwax deep into the ear canal and cause decreased hearing and pain. ?What increases the risk? ?This condition is more likely to develop in people who: ?Clean their ears often with cotton swabs. ?Pick at their ears. ?Use earplugs or in-ear headphones often, or wear hearing aids. ?The following factors may also make you more likely to develop this condition: ?Being male. ?Being of older age. ?Naturally producing more earwax. ?Having narrow ear canals. ?Having earwax that is overly thick or sticky. ?Having excess hair in the ear canal. ?Having eczema. ?Being dehydrated. ?What are the signs or symptoms? ?Symptoms of this condition include: ?Reduced or muffled hearing. ?A feeling of fullness in the ear or feeling that the ear is plugged. ?Fluid coming from the ear. ?Ear pain or an itchy ear. ?Ringing in the ear. ?Coughing. ?Balance problems. ?An obvious piece of earwax that can be seen inside the ear canal. ?How is this diagnosed? ?This condition may be diagnosed based on: ?Your symptoms. ?Your medical history. ?An ear exam. During the exam, your health care provider will look into your ear with an instrument called an otoscope. ?You may have tests, including a hearing test. ?How is this treated? ?This condition may be treated by: ?Using ear drops to soften the earwax. ?Having the earwax removed by a health care provider. The  health care provider may: ?Flush the ear with water. ?Use an instrument that has a loop on the end (curette). ?Use a suction device. ?Having surgery to remove the wax buildup. This may be done in severe cases. ?Follow these instructions at home: ? ?Take over-the-counter and prescription medicines only as told by your health care provider. ?Do not put any objects, including cotton swabs, into your ear. You can clean the opening of your ear canal with a washcloth or facial tissue. ?Follow instructions from your health care provider about cleaning your ears. Do not overclean your ears. ?Drink enough fluid to keep your urine pale yellow. This will help to thin the earwax. ?Keep all follow-up visits as told. If earwax builds up in your ears often or if you use hearing aids, consider seeing your health care provider for routine, preventive ear cleanings. Ask your health care provider how often you should schedule your cleanings. ?If you have hearing aids, clean them according to instructions from the manufacturer and your health care provider. ?Contact a health care provider if: ?You have ear pain. ?You develop a fever. ?You have pus or other fluid coming from your ear. ?You have hearing loss. ?You have ringing in your ears that does not go away. ?You feel like the room is spinning (vertigo). ?Your symptoms do not improve with treatment. ?Get help right away if: ?You have bleeding from the affected ear. ?You have severe ear pain. ?Summary ?Earwax can build up in the ear and cause discomfort or hearing loss. ?The most common symptoms of this condition include   reduced or muffled hearing, a feeling of fullness in the ear, or feeling that the ear is plugged. ?This condition may be diagnosed based on your symptoms, your medical history, and an ear exam. ?This condition may be treated by using ear drops to soften the earwax or by having the earwax removed by a health care provider. ?Do not put any objects, including cotton  swabs, into your ear. You can clean the opening of your ear canal with a washcloth or facial tissue. ?This information is not intended to replace advice given to you by your health care provider. Make sure you discuss any questions you have with your health care provider. ?Document Revised: 01/26/2020 Document Reviewed: 01/26/2020 ?Elsevier Patient Education ? 2022 Elsevier Inc[employer] ? ?

## 2021-05-18 NOTE — Progress Notes (Signed)
BP 137/85   Pulse 71   Temp 98.5 F (36.9 C) (Oral)   Ht 5' 10.87" (1.8 m)   Wt 201 lb 12.8 oz (91.5 kg)   SpO2 97%   BMI 28.25 kg/m    Subjective:    Patient ID: Dennis Byrd, male    DOB: 1951-01-01, 70 y.o.   MRN: [id number]  Chief Complaint  Patient presents with   Ear Pain    Right ear pain for the past few days.    New Patient (Initial Visit)    To establish care    HPI: Dennis Byrd is a 70 y.o. male  Pt is here to establish.  Sees urology for ? BPH - Dr. Jonne Ply , no meds. Co ear fullness   Ear Fullness  There is pain in the right ear. This is a new problem. The current episode started in the past 7 days. There has been no fever. Associated symptoms include hearing loss. Pertinent negatives include no abdominal pain, coughing, diarrhea, ear discharge, headaches, neck pain, rash, rhinorrhea, sore throat or vomiting.   Chief Complaint  Patient presents with   Ear Pain    Right ear pain for the past few days.    New Patient (Initial Visit)    To establish care    Relevant past medical, surgical, family and social history reviewed and updated as indicated. Interim medical history since our last visit reviewed. Allergies and medications reviewed and updated.  Review of Systems  HENT:  Positive for hearing loss. Negative for ear discharge, rhinorrhea and sore throat.   Respiratory:  Negative for cough.   Gastrointestinal:  Negative for abdominal pain, diarrhea and vomiting.  Musculoskeletal:  Negative for neck pain.  Skin:  Negative for rash.  Neurological:  Negative for headaches.   Per HPI unless specifically indicated above     Objective:    BP 137/85   Pulse 71   Temp 98.5 F (36.9 C) (Oral)   Ht 5' 10.87" (1.8 m)   Wt 201 lb 12.8 oz (91.5 kg)   SpO2 97%   BMI 28.25 kg/m   Wt Readings from Last 3 Encounters:  05/18/21 201 lb 12.8 oz (91.5 kg)  05/15/18 205 lb (93 kg)  05/04/16 203 lb (92.1 kg)    Physical Exam Vitals and nursing  note reviewed.  Constitutional:      General: He is not in acute distress.    Appearance: Normal appearance. He is not ill-appearing or diaphoretic.  HENT:     Head: Normocephalic and atraumatic.     Right Ear: Tympanic membrane and external ear normal. There is no impacted cerumen.     Left Ear: External ear normal.     Nose: No congestion or rhinorrhea.     Mouth/Throat:     Pharynx: No oropharyngeal exudate or posterior oropharyngeal erythema.  Eyes:     Conjunctiva/sclera: Conjunctivae normal.     Pupils: Pupils are equal, round, and reactive to light.  Cardiovascular:     Rate and Rhythm: Normal rate and regular rhythm.     Heart sounds: No murmur heard.   No friction rub. No gallop.  Pulmonary:     Effort: No respiratory distress.     Breath sounds: No stridor. No wheezing or rhonchi.  Chest:     Chest wall: No tenderness.  Abdominal:     General: Abdomen is flat. Bowel sounds are normal.     Palpations: Abdomen is soft. There is no mass.  Tenderness: There is no abdominal tenderness.  Musculoskeletal:     Cervical back: Normal range of motion and neck supple. No rigidity or tenderness.     Left lower leg: No edema.  Skin:    General: Skin is warm and dry.  Neurological:     Mental Status: He is alert.    Results for orders placed or performed in visit on 05/04/16  POCT glucose (manual entry)  Result Value Ref Range   POC Glucose 105 (A) 70 - 99 mg/dl        Current Outpatient Medications:    carbamide peroxide (DEBROX) 6.5 % OTIC solution, Place 5 drops into both ears 2 (two) times daily., Disp: 15 mL, Rfl: 0    Assessment & Plan:  Right ear fullness:  Cerumen impaction Start pt on debrox ear drops  2. Has a ho BPH  Fu and mx with urology   3. Elevated LFTs will recheck  If high consider [place] RUQ  Unsure etiology per pt doesn't drink ETOH much.   Problem List Items Addressed This Visit   None Visit Diagnoses     Hyperlipidemia, unspecified  hyperlipidemia type    -  Primary   Relevant Orders   TSH   Lipid panel   CBC with Differential/Platelet   Comprehensive metabolic panel   Urinalysis, Routine w reflex microscopic   Elevated LFTs       Relevant Orders   Lipid panel   CBC with Differential/Platelet   Comprehensive metabolic panel   Urinalysis, Routine w reflex microscopic        Orders Placed This Encounter  Procedures   TSH   Lipid panel   CBC with Differential/Platelet   Comprehensive metabolic panel   Urinalysis, Routine w reflex microscopic     Meds ordered this encounter  Medications   carbamide peroxide (DEBROX) 6.5 % OTIC solution    Sig: Place 5 drops into both ears 2 (two) times daily.    Dispense:  15 mL    Refill:  0     Follow up plan: Return in about 4 weeks (around 06/15/2021).

## 2021-05-19 LAB — COMPREHENSIVE METABOLIC PANEL
ALT: 16 IU/L (ref 0–44)
AST: 22 IU/L (ref 0–40)
Albumin/Globulin Ratio: 2.2 (ref 1.2–2.2)
Albumin: 4.6 g/dL (ref 3.7–4.7)
Alkaline Phosphatase: 72 IU/L (ref 44–121)
BUN/Creatinine Ratio: 10 (ref 10–24)
BUN: 14 mg/dL (ref 8–27)
Bilirubin Total: 0.3 mg/dL (ref 0.0–1.2)
CO2: 24 mmol/L (ref 20–29)
Calcium: 9.7 mg/dL (ref 8.6–10.2)
Chloride: 100 mmol/L (ref 96–106)
Creatinine, Ser: 1.35 mg/dL — ABNORMAL HIGH (ref 0.76–1.27)
Globulin, Total: 2.1 g/dL (ref 1.5–4.5)
Glucose: 86 mg/dL (ref 65–99)
Potassium: 4.4 mmol/L (ref 3.5–5.2)
Sodium: 140 mmol/L (ref 134–144)
Total Protein: 6.7 g/dL (ref 6.0–8.5)
eGFR: 56 mL/min/{1.73_m2} — ABNORMAL LOW (ref >59)

## 2021-05-19 LAB — CBC WITH DIFFERENTIAL/PLATELET
Basophils Absolute: 0 10*3/uL (ref 0.0–0.2)
Basos: 0 %
EOS (ABSOLUTE): 0.3 10*3/uL (ref 0.0–0.4)
Eos: 2 %
Hematocrit: 48.2 % (ref 37.5–51.0)
Hemoglobin: 16.5 g/dL (ref 13.0–17.7)
Immature Grans (Abs): 0 10*3/uL (ref 0.0–0.1)
Immature Granulocytes: 0 %
Lymphocytes Absolute: 2.3 10*3/uL (ref 0.7–3.1)
Lymphs: 22 %
MCH: 31.4 pg (ref 26.6–33.0)
MCHC: 34.2 g/dL (ref 31.5–35.7)
MCV: 92 fL (ref 79–97)
Monocytes Absolute: 0.6 10*3/uL (ref 0.1–0.9)
Monocytes: 6 %
Neutrophils Absolute: 7.4 10*3/uL — ABNORMAL HIGH (ref 1.4–7.0)
Neutrophils: 70 %
Platelets: 238 10*3/uL (ref 150–450)
RBC: 5.25 x10E6/uL (ref 4.14–5.80)
RDW: 13.3 % (ref 11.6–15.4)
WBC: 10.6 10*3/uL (ref 3.4–10.8)

## 2021-05-19 LAB — TSH: TSH: 0.684 u[IU]/mL (ref 0.450–4.500)

## 2021-05-19 LAB — LIPID PANEL
Chol/HDL Ratio: 4.3 ratio (ref 0.0–5.0)
Cholesterol, Total: 142 mg/dL (ref 100–199)
HDL: 33 mg/dL — ABNORMAL LOW (ref >39)
LDL Chol Calc (NIH): 76 mg/dL (ref 0–99)
Triglycerides: 193 mg/dL — ABNORMAL HIGH (ref 0–149)
VLDL Cholesterol Cal: 33 mg/dL (ref 5–40)

## 2021-05-19 NOTE — Progress Notes (Signed)
Please let pt know creat was high, needs to increase water intake to at least 6 bottles a day. Recheck next visit please. Thnx.

## 2021-05-26 ENCOUNTER — Ambulatory Visit (INDEPENDENT_AMBULATORY_CARE_PROVIDER_SITE_OTHER): Payer: Medicare Other

## 2021-05-26 ENCOUNTER — Other Ambulatory Visit: Payer: Self-pay

## 2021-05-26 DIAGNOSIS — H6123 Impacted cerumen, bilateral: Secondary | ICD-10-CM

## 2021-05-26 NOTE — Progress Notes (Signed)
Patient presented to office today for ear wax removal, bilateral. Both ears cleaned out and both tempanic membranes visible at the end of the procedure. Patient tolerated well and reports no dizzy or other complaints at this time.

## 2021-05-29 ENCOUNTER — Ambulatory Visit: Payer: Self-pay | Admitting: *Deleted

## 2021-05-29 NOTE — Telephone Encounter (Signed)
Patient seen on Friday and had ears cleaned by the nurse after using debrox for one week. Right ear now with constant ringing and hearing in that ear sounds muffled. No pain, no drainage from the ear and no fever. When he pushes on the ear he hears "like water movement or popping".Discussed this can be normal after ear cleanings for a short time. Routing to physician for any advice.   Reason for Disposition  [1] Mild tinnitus in both ears AND [2] only heard in quiet room  Answer Assessment - Initial Assessment Questions 1. DESCRIPTION: "Describe the sound you are hearing." (e.g., hissing, humming, pounding, ringing)     Ringing  2. LOCATION: "One or both ears?" If one, ask: "Which ear?"     Right ear 3. SEVERITY: "How bad is it?"    - MILD - doesn't interfere with normal activities, only can hear in a quiet room    - MODERATE-SEVERE (Bothersome): interferes with work, school, sleep, or other activities      moderate 4. ONSET: "When did this begin?" "Did it start suddenly or come on gradually?"     Over the weekend 5. PATTERN: "Does this come and go, or has it been constant since it started?"     constant 6. HEARING LOSS: "Is your hearing decreased?" (e.g., normal, decreased)       Sounds are muffled 7. OTHER SYMPTOMS: "Do you have any other symptoms?" (e.g., dizziness, earache)     no 8. PREGNANCY: "Is there any chance you are pregnant?" "When was your last menstrual period?"     na  Protocols used: Tinnitus-A-AH

## 2021-05-30 NOTE — Telephone Encounter (Signed)
Appt if he wants to make sure ear looks ok

## 2021-06-01 ENCOUNTER — Ambulatory Visit (INDEPENDENT_AMBULATORY_CARE_PROVIDER_SITE_OTHER): Payer: Medicare Other | Admitting: Family Medicine

## 2021-06-01 ENCOUNTER — Encounter: Payer: Self-pay | Admitting: Family Medicine

## 2021-06-01 ENCOUNTER — Other Ambulatory Visit: Payer: Self-pay

## 2021-06-01 VITALS — BP 134/75 | HR 70 | Temp 97.6°F | Wt 205.2 lb

## 2021-06-01 DIAGNOSIS — H6121 Impacted cerumen, right ear: Secondary | ICD-10-CM | POA: Diagnosis not present

## 2021-06-01 NOTE — Progress Notes (Signed)
 BP 134/75   Pulse 70   Temp 97.6 F (36.4 C) (Oral)   Wt 205 lb 3.2 oz (93.1 kg)   SpO2 93%   BMI 28.73 kg/m    Subjective:    Patient ID: Dennis Byrd, male    DOB: 05/24/1951, 70 y.o.   MRN: [id number]  HPI: Dennis Byrd is a 70 y.o. male  Chief Complaint  Patient presents with   Ear Fullness    Patient states he still cannot hear out of his R ear   EAG CLOGGED Duration: 2-3 weeks Involved ear(s):  "right Sensation of feeling clogged/plugged: yes Decreased/muffled hearing:yes Ear pain: no Fever: no Otorrhea: no Hearing loss: yes Upper respiratory infection symptoms: no Using Q-Tips: no Status: stable and fluctuating History of cerumenosis: yes Treatments attempted:  ear flushed   Relevant past medical, surgical, family and social history reviewed and updated as indicated. Interim medical history since our last visit reviewed. Allergies and medications reviewed and updated.  Review of Systems  Constitutional: Negative.   HENT: Negative.  Negative for congestion, dental problem, drooling, ear discharge, ear pain, facial swelling, hearing loss, mouth sores, nosebleeds, postnasal drip, rhinorrhea, sinus pressure, sinus pain, sneezing, sore throat, tinnitus, trouble swallowing and voice change.   Respiratory: Negative.    Cardiovascular: Negative.   Gastrointestinal: Negative.   Musculoskeletal: Negative.   Psychiatric/Behavioral: Negative.     Per HPI unless specifically indicated above     Objective:    BP 134/75   Pulse 70   Temp 97.6 F (36.4 C) (Oral)   Wt 205 lb 3.2 oz (93.1 kg)   SpO2 93%   BMI 28.73 kg/m   Wt Readings from Last 3 Encounters:  06/01/21 205 lb 3.2 oz (93.1 kg)  05/18/21 201 lb 12.8 oz (91.5 kg)  05/15/18 205 lb (93 kg)    Physical Exam Vitals and nursing note reviewed.  Constitutional:      General: He is not in acute distress.    Appearance: Normal appearance. He is not ill-appearing, toxic-appearing or diaphoretic.  HENT:      Head: Normocephalic and atraumatic.     Right Ear: External ear normal. There is impacted cerumen.     Left Ear: Tympanic membrane, ear canal and external ear normal.     Nose: Nose normal.     Mouth/Throat:     Mouth: Mucous membranes are moist.     Pharynx: Oropharynx is clear.  Eyes:     General: No scleral icterus.       Right eye: No discharge.        Left eye: No discharge.     Extraocular Movements: Extraocular movements intact.     Conjunctiva/sclera: Conjunctivae normal.     Pupils: Pupils are equal, round, and reactive to light.  Cardiovascular:     Rate and Rhythm: Normal rate and regular rhythm.     Pulses: Normal pulses.     Heart sounds: Normal heart sounds. No murmur heard.   No friction rub. No gallop.  Pulmonary:     Effort: Pulmonary effort is normal. No respiratory distress.     Breath sounds: Normal breath sounds. No stridor. No wheezing, rhonchi or rales.  Chest:     Chest wall: No tenderness.  Musculoskeletal:        General: Normal range of motion.     Cervical back: Normal range of motion and neck supple.  Skin:    General: Skin is warm and dry.       Capillary Refill: Capillary refill takes less than 2 seconds.     Coloration: Skin is not jaundiced or pale.     Findings: No bruising, erythema, lesion or rash.  Neurological:     General: No focal deficit present.     Mental Status: He is alert and oriented to person, place, and time. Mental status is at baseline.  Psychiatric:        Mood and Affect: Mood normal.        Behavior: Behavior normal.        Thought Content: Thought content normal.        Judgment: Judgment normal.    Results for orders placed or performed in visit on 05/18/21  Microscopic Examination   Urine  Result Value Ref Range   WBC, UA 0-5 0 - 5 /hpf   RBC 0-2 0 - 2 /hpf   Epithelial Cells (non renal) None seen 0 - 10 /hpf   Bacteria, UA None seen None seen/Few  TSH  Result Value Ref Range   TSH 0.684 0.450 - 4.500  uIU/mL  Lipid panel  Result Value Ref Range   Cholesterol, Total 142 100 - 199 mg/dL   Triglycerides 193 (H) 0 - 149 mg/dL   HDL 33 (L) >39 mg/dL   VLDL Cholesterol Cal 33 5 - 40 mg/dL   LDL Chol Calc (NIH) 76 0 - 99 mg/dL   Chol/HDL Ratio 4.3 0.0 - 5.0 ratio  CBC with Differential/Platelet  Result Value Ref Range   WBC 10.6 3.4 - 10.8 x10E3/uL   RBC 5.25 4.14 - 5.80 x10E6/uL   Hemoglobin 16.5 13.0 - 17.7 g/dL   Hematocrit 48.2 37.5 - 51.0 %   MCV 92 79 - 97 fL   MCH 31.4 26.6 - 33.0 pg   MCHC 34.2 31.5 - 35.7 g/dL   RDW 13.3 11.6 - 15.4 %   Platelets 238 150 - 450 x10E3/uL   Neutrophils 70 Not Estab. %   Lymphs 22 Not Estab. %   Monocytes 6 Not Estab. %   Eos 2 Not Estab. %   Basos 0 Not Estab. %   Neutrophils Absolute 7.4 (H) 1.4 - 7.0 x10E3/uL   Lymphocytes Absolute 2.3 0.7 - 3.1 x10E3/uL   Monocytes Absolute 0.6 0.1 - 0.9 x10E3/uL   EOS (ABSOLUTE) 0.3 0.0 - 0.4 x10E3/uL   Basophils Absolute 0.0 0.0 - 0.2 x10E3/uL   Immature Granulocytes 0 Not Estab. %   Immature Grans (Abs) 0.0 0.0 - 0.1 x10E3/uL  Comprehensive metabolic panel  Result Value Ref Range   Glucose 86 65 - 99 mg/dL   BUN 14 8 - 27 mg/dL   Creatinine, Ser 1.35 (H) 0.76 - 1.27 mg/dL   eGFR 56 (L) >59 mL/min/1.73   BUN/Creatinine Ratio 10 10 - 24   Sodium 140 134 - 144 mmol/L   Potassium 4.4 3.5 - 5.2 mmol/L   Chloride 100 96 - 106 mmol/L   CO2 24 20 - 29 mmol/L   Calcium 9.7 8.6 - 10.2 mg/dL   Total Protein 6.7 6.0 - 8.5 g/dL   Albumin 4.6 3.7 - 4.7 g/dL   Globulin, Total 2.1 1.5 - 4.5 g/dL   Albumin/Globulin Ratio 2.2 1.2 - 2.2   Bilirubin Total 0.3 0.0 - 1.2 mg/dL   Alkaline Phosphatase 72 44 - 121 IU/L   AST 22 0 - 40 IU/L   ALT 16 0 - 44 IU/L  Urinalysis, Routine w reflex microscopic  Result Value Ref Range  Specific Gravity, UA 1.010 1.005 - 1.030   pH, UA 6.5 5.0 - 7.5   Color, UA Yellow Yellow   Appearance Ur Clear Clear   Leukocytes,UA 1+ (A) Negative   Protein,UA Negative  Negative/Trace   Glucose, UA Negative Negative   Ketones, UA Negative Negative   RBC, UA Trace (A) Negative   Bilirubin, UA Negative Negative   Urobilinogen, Ur 0.2 0.2 - 1.0 mg/dL   Nitrite, UA Negative Negative   Microscopic Examination See below:       Assessment & Plan:   Problem List Items Addressed This Visit   None Visit Diagnoses     Hearing loss due to cerumen impaction, right    -  Primary   Ears flushed today with good results. Symptoms resolved. Call with any concerns.         Follow up plan: Return if symptoms worsen or fail to improve.

## 2021-06-15 ENCOUNTER — Ambulatory Visit: Payer: Medicare Other | Admitting: Internal Medicine

## 2021-09-07 ENCOUNTER — Other Ambulatory Visit: Payer: Self-pay

## 2021-09-07 ENCOUNTER — Ambulatory Visit (INDEPENDENT_AMBULATORY_CARE_PROVIDER_SITE_OTHER): Payer: Medicare Other | Admitting: Internal Medicine

## 2021-09-07 ENCOUNTER — Encounter: Payer: Self-pay | Admitting: Internal Medicine

## 2021-09-07 VITALS — BP 120/70 | HR 82 | Temp 98.4°F | Ht 70.87 in | Wt 211.8 lb

## 2021-09-07 DIAGNOSIS — Z23 Encounter for immunization: Secondary | ICD-10-CM | POA: Diagnosis not present

## 2021-09-07 DIAGNOSIS — R350 Frequency of micturition: Secondary | ICD-10-CM | POA: Diagnosis not present

## 2021-09-07 LAB — MICROSCOPIC EXAMINATION
Bacteria, UA: NONE SEEN
Epithelial Cells (non renal): NONE SEEN /hpf (ref 0–10)
WBC, UA: NONE SEEN /hpf (ref 0–5)

## 2021-09-07 LAB — URINALYSIS, ROUTINE W REFLEX MICROSCOPIC
Bilirubin, UA: NEGATIVE
Glucose, UA: NEGATIVE
Ketones, UA: NEGATIVE
Leukocytes,UA: NEGATIVE
Nitrite, UA: NEGATIVE
Protein,UA: NEGATIVE
Specific Gravity, UA: <1.005 — ABNORMAL LOW (ref 1.005–1.030)
Urobilinogen, Ur: 0.2 mg/dL (ref 0.2–1.0)
pH, UA: 6.5 (ref 5.0–7.5)

## 2021-09-07 MED ORDER — NITROFURANTOIN MONOHYD MACRO 100 MG PO CAPS: 100.0000 mg | ORAL_CAPSULE | Freq: Two times a day (BID) | ORAL | 0 refills | Status: AC

## 2021-09-07 NOTE — Progress Notes (Signed)
BP 120/70   Pulse 82   Temp 98.4 F (36.9 C) (Oral)   Ht 5' 10.87" (1.8 m)   Wt 211 lb 12.8 oz (96.1 kg)   SpO2 96%   BMI 29.65 kg/m    Subjective:    Patient ID: Dennis Byrd, male    DOB: 1951/09/22, 70 y.o.   MRN: [id number]  Chief Complaint  Patient presents with   Urinary Frequency    Started yesterday, with burning burning urination    HPI: Dennis Byrd is a 70 y.o. male  Urinary Frequency  This is a new (sees urology for his BPH @ [facility] problem. The quality of the pain is described as burning (stings and burns per pt has an achy feeling as well per pt.). There has been no fever. Associated symptoms include frequency and urgency. Pertinent negatives include no chills, discharge, flank pain, hematuria, hesitancy, nausea, possible pregnancy, sweats or vomiting. There is no history of catheterization, recurrent UTIs, a single kidney, urinary stasis or a urological procedure.   Chief Complaint  Patient presents with   Urinary Frequency    Started yesterday, with burning burning urination    Relevant past medical, surgical, family and social history reviewed and updated as indicated. Interim medical history since our last visit reviewed. Allergies and medications reviewed and updated.  Review of Systems  Constitutional:  Negative for chills.  Gastrointestinal:  Negative for nausea and vomiting.  Genitourinary:  Positive for frequency and urgency. Negative for flank pain, hematuria and hesitancy.   Per HPI unless specifically indicated above     Objective:    BP 120/70   Pulse 82   Temp 98.4 F (36.9 C) (Oral)   Ht 5' 10.87" (1.8 m)   Wt 211 lb 12.8 oz (96.1 kg)   SpO2 96%   BMI 29.65 kg/m   Wt Readings from Last 3 Encounters:  09/07/21 211 lb 12.8 oz (96.1 kg)  06/01/21 205 lb 3.2 oz (93.1 kg)  05/18/21 201 lb 12.8 oz (91.5 kg)    Physical Exam Vitals and nursing note reviewed.  Constitutional:      General: He is not in acute  distress.    Appearance: Normal appearance. He is not ill-appearing or diaphoretic.  Pulmonary:     Effort: No respiratory distress.     Breath sounds: No stridor. No wheezing or rhonchi.  Chest:     Chest wall: No tenderness.  Musculoskeletal:        General: No swelling.     Left lower leg: No edema.  Neurological:     Mental Status: He is alert.    Results for orders placed or performed in visit on 05/18/21  Microscopic Examination   Urine  Result Value Ref Range   WBC, UA 0-5 0 - 5 /hpf   RBC 0-2 0 - 2 /hpf   Epithelial Cells (non renal) None seen 0 - 10 /hpf   Bacteria, UA None seen None seen/Few  TSH  Result Value Ref Range   TSH 0.684 0.450 - 4.500 uIU/mL  Lipid panel  Result Value Ref Range   Cholesterol, Total 142 100 - 199 mg/dL   Triglycerides 193 (H) 0 - 149 mg/dL   HDL 33 (L) >39 mg/dL   VLDL Cholesterol Cal 33 5 - 40 mg/dL   LDL Chol Calc (NIH) 76 0 - 99 mg/dL   Chol/HDL Ratio 4.3 0.0 - 5.0 ratio  CBC with Differential/Platelet  Result Value Ref Range  WBC 10.6 3.4 - 10.8 x10E3/uL   RBC 5.25 4.14 - 5.80 x10E6/uL   Hemoglobin 16.5 13.0 - 17.7 g/dL   Hematocrit 48.2 37.5 - 51.0 %   MCV 92 79 - 97 fL   MCH 31.4 26.6 - 33.0 pg   MCHC 34.2 31.5 - 35.7 g/dL   RDW 13.3 11.6 - 15.4 %   Platelets 238 150 - 450 x10E3/uL   Neutrophils 70 Not Estab. %   Lymphs 22 Not Estab. %   Monocytes 6 Not Estab. %   Eos 2 Not Estab. %   Basos 0 Not Estab. %   Neutrophils Absolute 7.4 (H) 1.4 - 7.0 x10E3/uL   Lymphocytes Absolute 2.3 0.7 - 3.1 x10E3/uL   Monocytes Absolute 0.6 0.1 - 0.9 x10E3/uL   EOS (ABSOLUTE) 0.3 0.0 - 0.4 x10E3/uL   Basophils Absolute 0.0 0.0 - 0.2 x10E3/uL   Immature Granulocytes 0 Not Estab. %   Immature Grans (Abs) 0.0 0.0 - 0.1 x10E3/uL  Comprehensive metabolic panel  Result Value Ref Range   Glucose 86 65 - 99 mg/dL   BUN 14 8 - 27 mg/dL   Creatinine, Ser 1.35 (H) 0.76 - 1.27 mg/dL   eGFR 56 (L) >59 mL/min/1.73   BUN/Creatinine Ratio 10 10  - 24   Sodium 140 134 - 144 mmol/L   Potassium 4.4 3.5 - 5.2 mmol/L   Chloride 100 96 - 106 mmol/L   CO2 24 20 - 29 mmol/L   Calcium 9.7 8.6 - 10.2 mg/dL   Total Protein 6.7 6.0 - 8.5 g/dL   Albumin 4.6 3.7 - 4.7 g/dL   Globulin, Total 2.1 1.5 - 4.5 g/dL   Albumin/Globulin Ratio 2.2 1.2 - 2.2   Bilirubin Total 0.3 0.0 - 1.2 mg/dL   Alkaline Phosphatase 72 44 - 121 IU/L   AST 22 0 - 40 IU/L   ALT 16 0 - 44 IU/L  Urinalysis, Routine w reflex microscopic  Result Value Ref Range   Specific Gravity, UA 1.010 1.005 - 1.030   pH, UA 6.5 5.0 - 7.5   Color, UA Yellow Yellow   Appearance Ur Clear Clear   Leukocytes,UA 1+ (A) Negative   Protein,UA Negative Negative/Trace   Glucose, UA Negative Negative   Ketones, UA Negative Negative   RBC, UA Trace (A) Negative   Bilirubin, UA Negative Negative   Urobilinogen, Ur 0.2 0.2 - 1.0 mg/dL   Nitrite, UA Negative Negative   Microscopic Examination See below:         Current Outpatient Medications:    carbamide peroxide (DEBROX) 6.5 % OTIC solution, Place 5 drops into both ears 2 (two) times daily. (Patient not taking: Reported on 06/01/2021), Disp: 15 mL, Rfl: 0    Assessment & Plan:   UTI:  check UA.  pt is currently symptomatic for an Urinary tract infection(abd pain, burning etc), will cover with emperic abx, see med module for details.  encouraged to increase water/fluid intake.Signs and symptoms of emergency were discussed with the patient. The risks, benefits and side effects of treatment were discussed with the patient. The patient verbalized an understanding of plan, and was told to call the clinic/go to the ED if symptoms worsen at any point of time.     Problem List Items Addressed This Visit   None Visit Diagnoses     Urinary frequency    -  Primary   Relevant Orders   Urinalysis, Routine w reflex microscopic   Urine Culture   Need  for influenza vaccination       Relevant Orders   Flu Vaccine QUAD High Dose(Fluad)         Orders Placed This Encounter  Procedures   Urine Culture   Flu Vaccine QUAD High Dose(Fluad)   Urinalysis, Routine w reflex microscopic     No orders of the defined types were placed in this encounter.    Follow up plan: No follow-ups on file.

## 2021-09-09 LAB — URINE CULTURE: Organism ID, Bacteria: NO GROWTH

## 2021-09-12 ENCOUNTER — Telehealth: Payer: Self-pay | Admitting: Internal Medicine

## 2021-09-12 ENCOUNTER — Ambulatory Visit (INDEPENDENT_AMBULATORY_CARE_PROVIDER_SITE_OTHER): Payer: Medicare Other | Admitting: *Deleted

## 2021-09-12 DIAGNOSIS — Z0000 Encounter for general adult medical examination without abnormal findings: Secondary | ICD-10-CM | POA: Diagnosis not present

## 2021-09-12 DIAGNOSIS — R350 Frequency of micturition: Secondary | ICD-10-CM

## 2021-09-12 NOTE — Patient Instructions (Signed)
[name] , Thank you for taking time to come for your Medicare Wellness Visit. I appreciate your ongoing commitment to your health goals. Please review the following plan we discussed and let me know if I can assist you in the future.   Screening recommendations/referrals: Colonoscopy: up to date  per patient  Recommended yearly ophthalmology/optometry visit for glaucoma screening and checkup Recommended yearly dental visit for hygiene and checkup  Vaccinations: Influenza vaccine: up to date Pneumococcal vaccine: Education provided Tdap vaccine: Education provided Shingles vaccine: Education provided    Advanced directives: Education provided  Conditions/risks identified:     Preventive Care 3 Years and Older, Male Preventive care refers to lifestyle choices and visits with your health care provider that can promote health and wellness. What does preventive care include? A yearly physical exam. This is also called an annual well check. Dental exams once or twice a year. Routine eye exams. Ask your health care provider how often you should have your eyes checked. Personal lifestyle choices, including: Daily care of your teeth and gums. Regular physical activity. Eating a healthy diet. Avoiding tobacco and drug use. Limiting alcohol use. Practicing safe sex. Taking low doses of aspirin every day. Taking vitamin and mineral supplements as recommended by your health care provider. What happens during an annual well check? The services and screenings done by your health care provider during your annual well check will depend on your age, overall health, lifestyle risk factors, and family history of disease. Counseling  Your health care provider may ask you questions about your: Alcohol use. Tobacco use. Drug use. Emotional well-being. Home and relationship well-being. Sexual activity. Eating habits. History of falls. Memory and ability to understand (cognition). Work and  work Statistician. Screening  You may have the following tests or measurements: Height, weight, and BMI. Blood pressure. Lipid and cholesterol levels. These may be checked every 5 years, or more frequently if you are over 33 years old. Skin check. Lung cancer screening. You may have this screening every year starting at age 50 if you have a 30-pack-year history of smoking and currently smoke or have quit within the past 15 years. Fecal occult blood test (FOBT) of the stool. You may have this test every year starting at age 52. Flexible sigmoidoscopy or colonoscopy. You may have a sigmoidoscopy every 5 years or a colonoscopy every 10 years starting at age 89. Prostate cancer screening. Recommendations will vary depending on your family history and other risks. Hepatitis C blood test. Hepatitis B blood test. Sexually transmitted disease (STD) testing. Diabetes screening. This is done by checking your blood sugar (glucose) after you have not eaten for a while (fasting). You may have this done every 1-3 years. Abdominal aortic aneurysm (AAA) screening. You may need this if you are a current or former smoker. Osteoporosis. You may be screened starting at age 33 if you are at high risk. Talk with your health care provider about your test results, treatment options, and if necessary, the need for more tests. Vaccines  Your health care provider may recommend certain vaccines, such as: Influenza vaccine. This is recommended every year. Tetanus, diphtheria, and acellular pertussis (Tdap, Td) vaccine. You may need a Td booster every 10 years. Zoster vaccine. You may need this after age 87. Pneumococcal 13-valent conjugate (PCV13) vaccine. One dose is recommended after age 67. Pneumococcal polysaccharide (PPSV23) vaccine. One dose is recommended after age 44. Talk to your health care provider about which screenings and vaccines you need and  how often you need them. This information is not intended to  replace advice given to you by your health care provider. Make sure you discuss any questions you have with your health care provider. Document Released: 11/04/2015 Document Revised: 06/27/2016 Document Reviewed: 08/09/2015 Elsevier Interactive Patient Education  2017 [place] Prevention in the Home Falls can cause injuries. They can happen to people of all ages. There are many things you can do to make your home safe and to help prevent falls. What can I do on the outside of my home? Regularly fix the edges of walkways and driveways and fix any cracks. Remove anything that might make you trip as you walk through a door, such as a raised step or threshold. Trim any bushes or trees on the path to your home. Use bright outdoor lighting. Clear any walking paths of anything that might make someone trip, such as rocks or tools. Regularly check to see if handrails are loose or broken. Make sure that both sides of any steps have handrails. Any raised decks and porches should have guardrails on the edges. Have any leaves, snow, or ice cleared regularly. Use sand or salt on walking paths during winter. Clean up any spills in your garage right away. This includes oil or grease spills. What can I do in the bathroom? Use night lights. Install grab bars by the toilet and in the tub and shower. Do not use towel bars as grab bars. Use non-skid mats or decals in the tub or shower. If you need to sit down in the shower, use a plastic, non-slip stool. Keep the floor dry. Clean up any water that spills on the floor as soon as it happens. Remove soap buildup in the tub or shower regularly. Attach bath mats securely with double-sided non-slip rug tape. Do not have throw rugs and other things on the floor that can make you trip. What can I do in the bedroom? Use night lights. Make sure that you have a light by your bed that is easy to reach. Do not use any sheets or blankets that are too big for  your bed. They should not hang down onto the floor. Have a firm chair that has side arms. You can use this for support while you get dressed. Do not have throw rugs and other things on the floor that can make you trip. What can I do in the kitchen? Clean up any spills right away. Avoid walking on wet floors. Keep items that you use a lot in easy-to-reach places. If you need to reach something above you, use a strong step stool that has a grab bar. Keep electrical cords out of the way. Do not use floor polish or wax that makes floors slippery. If you must use wax, use non-skid floor wax. Do not have throw rugs and other things on the floor that can make you trip. What can I do with my stairs? Do not leave any items on the stairs. Make sure that there are handrails on both sides of the stairs and use them. Fix handrails that are broken or loose. Make sure that handrails are as long as the stairways. Check any carpeting to make sure that it is firmly attached to the stairs. Fix any carpet that is loose or worn. Avoid having throw rugs at the top or bottom of the stairs. If you do have throw rugs, attach them to the floor with carpet tape. Make sure that you have  a light switch at the top of the stairs and the bottom of the stairs. If you do not have them, ask someone to add them for you. What else can I do to help prevent falls? Wear shoes that: Do not have high heels. Have rubber bottoms. Are comfortable and fit you well. Are closed at the toe. Do not wear sandals. If you use a stepladder: Make sure that it is fully opened. Do not climb a closed stepladder. Make sure that both sides of the stepladder are locked into place. Ask someone to hold it for you, if possible. Clearly mark and make sure that you can see: Any grab bars or handrails. First and last steps. Where the edge of each step is. Use tools that help you move around (mobility aids) if they are needed. These  include: Canes. Walkers. Scooters. Crutches. Turn on the lights when you go into a dark area. Replace any light bulbs as soon as they burn out. Set up your furniture so you have a clear path. Avoid moving your furniture around. If any of your floors are uneven, fix them. If there are any pets around you, be aware of where they are. Review your medicines with your doctor. Some medicines can make you feel dizzy. This can increase your chance of falling. Ask your doctor what other things that you can do to help prevent falls. This information is not intended to replace advice given to you by your health care provider. Make sure you discuss any questions you have with your health care provider. Document Released: 08/04/2009 Document Revised: 03/15/2016 Document Reviewed: 11/12/2014 Elsevier Interactive Patient Education  2017 [employer].

## 2021-09-12 NOTE — Telephone Encounter (Signed)
Copied from [place] [phone]. Topic: General - Other >> Sep 12, 2021  9:17 AM Holley Dexter N wrote: Reason for CRM: Pt called in stating he is feeling better but not quite all the way there yet, and know that the holidays are coming up and wanted to see about getting a few extra of the nitrofurantoin, macrocrystal-monohydrate, (MACROBID) 100 MG capsule just to make sure he is good. Please advise

## 2021-09-12 NOTE — Progress Notes (Signed)
Subjective:   Dennis Byrd is a 70 y.o. male who presents for Medicare Annual/Subsequent preventive examination.  I connected with  Siddhanth Macon on 09/12/21 by a telephone enabled telemedicine application and verified that I am speaking with the correct person using two identifiers.   I discussed the limitations of evaluation and management by telemedicine. The patient expressed understanding and agreed to proceed.  Patient location: home  Provider location:Tele- health  not in office    Review of Systems   Cardiac Risk Factors include: advanced age (>57men, >48 women);male gender     Objective:    Today's Vitals   There is no height or weight on file to calculate BMI.  No flowsheet data found.  Current Medications (verified) Outpatient Encounter Medications as of 09/12/2021  Medication Sig   Omega-3 Fatty Acids (FISH OIL) 1000 MG CAPS Take by mouth.   carbamide peroxide (DEBROX) 6.5 % OTIC solution Place 5 drops into both ears 2 (two) times daily. (Patient not taking: Reported on 06/01/2021)   nitrofurantoin, macrocrystal-monohydrate, (MACROBID) 100 MG capsule Take 1 capsule (100 mg total) by mouth 2 (two) times daily for 5 days. (Patient not taking: Reported on 09/12/2021)   No facility-administered encounter medications on file as of 09/12/2021.    Allergies (verified) Patient has no known allergies.   History: No past medical history on file. Past Surgical History:  Procedure Laterality Date   HERNIA REPAIR     No family history on file. Social History   Socioeconomic History   Marital status: Married    Spouse name: Not on file   Number of children: Not on file   Years of education: Not on file   Highest education level: Not on file  Occupational History   Not on file  Tobacco Use   Smoking status: Never   Smokeless tobacco: Never  Vaping Use   Vaping Use: Never used  Substance and Sexual Activity   Alcohol use: No    Alcohol/week: 0.0 standard  drinks   Drug use: No   Sexual activity: Yes  Other Topics Concern   Not on file  Social History Narrative   ** Merged History Encounter **       Social Determinants of Health   Financial Resource Strain: Low Risk    Difficulty of Paying Living Expenses: Not hard at all  Food Insecurity: Not on file  Transportation Needs: Not on file  Physical Activity: Not on file  Stress: No Stress Concern Present   Feeling of Stress : Not at all  Social Connections: Moderately Integrated   Frequency of Communication with Friends and Family: More than three times a week   Frequency of Social Gatherings with Friends and Family: More than three times a week   Attends Religious Services: More than 4 times per year   Active Member of [employer] or Organizations: No   Attends Music therapist: Never   Marital Status: Living with partner    Tobacco Counseling Counseling given: Not Answered   Clinical Intake:  Pre-visit preparation completed: Yes  Pain : No/denies pain        How often do you need to have someone help you when you read instructions, pamphlets, or other written materials from your doctor or pharmacy?: 1 - Never  Diabetic?no  Interpreter Needed?: No  Information entered by :: Leroy Kennedy LPN   Activities of Daily Living In your present state of health, do you have any difficulty performing the following activities:  09/12/2021 06/01/2021  Hearing? N N  Vision? N N  Difficulty concentrating or making decisions? N N  Walking or climbing stairs? N N  Dressing or bathing? N N  Doing errands, shopping? N N  Preparing Food and eating ? N -  Using the Toilet? N -  In the past six months, have you accidently leaked urine? N -  Do you have problems with loss of bowel control? N -  Managing your Medications? N -  Managing your Finances? N -  Housekeeping or managing your Housekeeping? N -  Some recent data might be hidden    Patient Care Team: Charlynne Cousins,  MD as PCP - General (Internal Medicine)  Indicate any recent Medical Services you may have received from other than [facility] providers in the past year (date may be approximate).     Assessment:   This is a routine wellness examination for Dennis Byrd.  Hearing/Vision screen Hearing Screening - Comments:: No trouble hearing Vision Screening - Comments:: [facility] Not up to date  Dietary issues and exercise activities discussed: Current Exercise Habits: The patient has a physically strenuous job, but has no regular exercise apart from work., Exercise limited by: None identified   Goals Addressed             This Visit's Progress    Weight (lb) < 200 lb (90.7 kg)       Loose 10 lbs       Depression Screen PHQ 2/9 Scores 09/07/2021 05/18/2021 05/04/2016  PHQ - 2 Score 0 0 0  PHQ- 9 Score 0 0 -    Fall Risk Fall Risk  09/07/2021 06/01/2021 05/18/2021 05/04/2016  Falls in the past year? 0 0 0 No  Number falls in past yr: 0 0 0 -  Injury with Fall? 0 0 0 -  Risk for fall due to : No Fall Risks No Fall Risks No Fall Risks -  Follow up Falls evaluation completed Falls evaluation completed Falls evaluation completed -    FALL RISK PREVENTION PERTAINING TO THE HOME:  Any stairs in or around the home? Yes  If so, are there any without handrails? No  Home free of loose throw rugs in walkways, pet beds, electrical cords, etc? Yes  Adequate lighting in your home to reduce risk of falls? Yes   ASSISTIVE DEVICES UTILIZED TO PREVENT FALLS:  Life alert? No  Use of a cane, walker or w/c? No  Grab bars in the bathroom? No  Shower chair or bench in shower? No  Elevated toilet seat or a handicapped toilet? No   TIMED UP AND GO:  Was the test performed? No .    Cognitive Function:  Normal cognitive status assessed by direct observation by this Nurse Health Advisor. No abnormalities found.          Immunizations Immunization History  Administered Date(s) Administered   Fluad  Quad(high Dose 65+) 09/07/2021   Influenza Split 10/07/2013   Influenza, High Dose Seasonal PF 08/07/2017   Moderna Sars-Covid-2 Vaccination 12/08/2019, 01/05/2020    TDAP status: Due, Education has been provided regarding the importance of this vaccine. Advised may receive this vaccine at local pharmacy or Health Dept. Aware to provide a copy of the vaccination record if obtained from local pharmacy or Health Dept. Verbalized acceptance and understanding.  Flu Vaccine status: Up to date  Pneumococcal vaccine status: Declined,  Education has been provided regarding the importance of this vaccine but patient still declined. Advised may  receive this vaccine at local pharmacy or Health Dept. Aware to provide a copy of the vaccination record if obtained from local pharmacy or Health Dept. Verbalized acceptance and understanding.   Covid-19 vaccine status: Declined, Education has been provided regarding the importance of this vaccine but patient still declined. Advised may receive this vaccine at local pharmacy or Health Dept.or vaccine clinic. Aware to provide a copy of the vaccination record if obtained from local pharmacy or Health Dept. Verbalized acceptance and understanding.  Qualifies for Shingles Vaccine? Yes   Zostavax completed No   Shingrix Completed?: No.    Education has been provided regarding the importance of this vaccine. Patient has been advised to call insurance company to determine out of pocket expense if they have not yet received this vaccine. Advised may also receive vaccine at local pharmacy or Health Dept. Verbalized acceptance and understanding.  Screening Tests Health Maintenance  Topic Date Due   Hepatitis C Screening  Never done   TETANUS/TDAP  Never done   COLONOSCOPY (Pts 45-14yrs Insurance coverage will need to be confirmed)  Never done   Zoster Vaccines- Shingrix (1 of 2) Never done   COVID-19 Vaccine (3 - Booster for Moderna series) 09/28/2021 (Originally  03/01/2020)   Pneumonia Vaccine 107+ Years old (1 - PCV) 09/12/2022 (Originally 05/08/2015)   INFLUENZA VACCINE  Completed   HPV VACCINES  Aged Out    Health Maintenance  Health Maintenance Due  Topic Date Due   Hepatitis C Screening  Never done   TETANUS/TDAP  Never done   COLONOSCOPY (Pts 45-55yrs Insurance coverage will need to be confirmed)  Never done   Zoster Vaccines- Shingrix (1 of 2) Never done    Colonoscopy per patient completed    Lung Cancer Screening: (Low Dose CT Chest recommended if Age 65-80 years, 30 pack-year currently smoking OR have quit w/in 15years.) qualify.   Lung Cancer Screening Referral:   Additional Screening:  Hepatitis C Screening: does qualify;  Vision Screening: Recommended annual ophthalmology exams for early detection of glaucoma and other disorders of the eye. Is the patient up to date with their annual eye exam?  Yes  Who is the provider or what is the name of the office in which the patient attends annual eye exams? [facility] If pt is not established with a provider, would they like to be referred to a provider to establish care? No .   Dental Screening: Recommended annual dental exams for proper oral hygiene  Community Resource Referral / Chronic Care Management: CRR required this visit?  No   CCM required this visit?  No      Plan:     I have personally reviewed and noted the following in the patient's chart:   Medical and social history Use of alcohol, tobacco or illicit drugs  Current medications and supplements including opioid prescriptions. Patient is not currently taking opioid prescriptions. Functional ability and status Nutritional status Physical activity Advanced directives List of other physicians Hospitalizations, surgeries, and ER visits in previous 12 months Vitals Screenings to include cognitive, depression, and falls Referrals and appointments  In addition, I have reviewed and discussed with patient  certain preventive protocols, quality metrics, and best practice recommendations. A written personalized care plan for preventive services as well as general preventive health recommendations were provided to patient.     Leroy Kennedy, LPN   [id number]   Nurse Notes:

## 2021-09-12 NOTE — Telephone Encounter (Signed)
Pt was upset that his refill was denied, pt wanted a call back to know why, please advise.

## 2021-09-13 NOTE — Telephone Encounter (Signed)
Routing to provider to advise on why medication was denied.

## 2021-09-19 NOTE — Telephone Encounter (Signed)
Patient notified of Dr. Levada Dy response. Patient states that he does not want to see Dr. Neomia Dear anymore and that he feels that he needed the antibiotic.   Patient states that he started having a cough and congestion Sunday evening. Offered to schedule an appointment, patient states he did not want to see a NP. Looked up Dr. Durenda Age schedule for the patient and let him know that she did not have any availability until next week. Patient asked well what would an  NP do? I explained that they would see and treat him accordingly. Patient then said to go ahead and schedule him to see Lauren on Thursday. Appointment scheduled.

## 2021-09-20 NOTE — Progress Notes (Signed)
Acute Office Visit  Subjective:    Patient ID: Dennis Byrd, male    DOB: 23-Mar-1951, 70 y.o.   MRN: [id number]  Chief Complaint  Patient presents with   URI    Pt states he started feeling bad Sunday evening. States he has had a cough, congestion, body aches, and chills.     HPI Patient is in today for cough and congestion that started on Sunday night, 5 days ago.  UPPER RESPIRATORY TRACT INFECTION  Worst symptom: cough Fever: yes - on Monday Cough: yes Shortness of breath: no Wheezing: no Chest pain: no Chest tightness: no Chest congestion: yes Nasal congestion: yes Runny nose: yes Post nasal drip: yes Sneezing: yes Sore throat: yes Swollen glands: no Sinus pressure:  sometimes Headache: no Face pain: no Toothache: no Ear pain: no  Ear pressure: no  Eyes red/itching:no Eye drainage/crusting: no  Vomiting: no Rash: no Fatigue: yes Sick contacts: yes Strep contacts: no  Context: fluctuating Recurrent sinusitis: no Relief with OTC cold/cough medications: yes  Treatments attempted: antihistamine, mucinex    History reviewed. No pertinent past medical history.  Past Surgical History:  Procedure Laterality Date   HERNIA REPAIR      History reviewed. No pertinent family history.  Social History   Socioeconomic History   Marital status: Married    Spouse name: Not on file   Number of children: Not on file   Years of education: Not on file   Highest education level: Not on file  Occupational History   Not on file  Tobacco Use   Smoking status: Never   Smokeless tobacco: Never  Vaping Use   Vaping Use: Never used  Substance and Sexual Activity   Alcohol use: No    Alcohol/week: 0.0 standard drinks   Drug use: No   Sexual activity: Yes  Other Topics Concern   Not on file  Social History Narrative   ** Merged History Encounter **       Social Determinants of Health   Financial Resource Strain: Low Risk    Difficulty of Paying Living  Expenses: Not hard at all  Food Insecurity: Not on file  Transportation Needs: Not on file  Physical Activity: Not on file  Stress: No Stress Concern Present   Feeling of Stress : Not at all  Social Connections: Moderately Integrated   Frequency of Communication with Friends and Family: More than three times a week   Frequency of Social Gatherings with Friends and Family: More than three times a week   Attends Religious Services: More than 4 times per year   Active Member of [employer] or Organizations: No   Attends Archivist Meetings: Never   Marital Status: Living with partner  Intimate Partner Violence: Not on file    Outpatient Medications Prior to Visit  Medication Sig Dispense Refill   Omega-3 Fatty Acids (FISH OIL) 1000 MG CAPS Take by mouth. (Patient not taking: Reported on 09/21/2021)     carbamide peroxide (DEBROX) 6.5 % OTIC solution Place 5 drops into both ears 2 (two) times daily. (Patient not taking: Reported on 06/01/2021) 15 mL 0   No facility-administered medications prior to visit.    No Known Allergies  Review of Systems  Constitutional:  Positive for fatigue and fever (on Monday).  HENT:  Positive for congestion, postnasal drip, rhinorrhea, sneezing and sore throat. Negative for ear pain and sinus pressure.   Eyes: Negative.   Respiratory:  Positive for cough. Negative for shortness  of breath.   Cardiovascular: Negative.   Gastrointestinal: Negative.   Endocrine: Negative.   Genitourinary: Negative.   Musculoskeletal:  Positive for myalgias.  Skin: Negative.   Neurological: Negative.   Psychiatric/Behavioral: Negative.        Objective:    Physical Exam Vitals and nursing note reviewed.  Constitutional:      Appearance: Normal appearance.  HENT:     Head: Normocephalic.     Right Ear: Tympanic membrane, ear canal and external ear normal.     Left Ear: Tympanic membrane, ear canal and external ear normal.  Eyes:     Conjunctiva/sclera:  Conjunctivae normal.  Cardiovascular:     Rate and Rhythm: Normal rate and regular rhythm.     Pulses: Normal pulses.     Heart sounds: Normal heart sounds.  Pulmonary:     Effort: Pulmonary effort is normal.     Breath sounds: Normal breath sounds.  Musculoskeletal:     Cervical back: Normal range of motion. No tenderness.  Lymphadenopathy:     Cervical: No cervical adenopathy.  Skin:    General: Skin is warm.  Neurological:     General: No focal deficit present.     Mental Status: He is alert and oriented to person, place, and time.  Psychiatric:        Mood and Affect: Mood normal.        Behavior: Behavior normal.        Thought Content: Thought content normal.        Judgment: Judgment normal.    BP 131/76   Pulse 76   Temp (!) 97.3 F (36.3 C) (Oral)   Wt 210 lb 9.6 oz (95.5 kg)   BMI 29.48 kg/m  Wt Readings from Last 3 Encounters:  09/21/21 210 lb 9.6 oz (95.5 kg)  09/07/21 211 lb 12.8 oz (96.1 kg)  06/01/21 205 lb 3.2 oz (93.1 kg)    Health Maintenance Due  Topic Date Due   Hepatitis C Screening  Never done   TETANUS/TDAP  Never done   COLONOSCOPY (Pts 45-82yrs Insurance coverage will need to be confirmed)  Never done   Zoster Vaccines- Shingrix (1 of 2) Never done    There are no preventive care reminders to display for this patient.   Lab Results  Component Value Date   TSH 0.684 05/18/2021   Lab Results  Component Value Date   WBC 10.6 05/18/2021   HGB 16.5 05/18/2021   HCT 48.2 05/18/2021   MCV 92 05/18/2021   PLT 238 05/18/2021   Lab Results  Component Value Date   NA 140 05/18/2021   K 4.4 05/18/2021   CO2 24 05/18/2021   GLUCOSE 86 05/18/2021   BUN 14 05/18/2021   CREATININE 1.35 (H) 05/18/2021   BILITOT 0.3 05/18/2021   ALKPHOS 72 05/18/2021   AST 22 05/18/2021   ALT 16 05/18/2021   PROT 6.7 05/18/2021   ALBUMIN 4.6 05/18/2021   CALCIUM 9.7 05/18/2021   EGFR 56 (L) 05/18/2021   Lab Results  Component Value Date   CHOL 142  05/18/2021   Lab Results  Component Value Date   HDL 33 (L) 05/18/2021   Lab Results  Component Value Date   LDLCALC 76 05/18/2021   Lab Results  Component Value Date   TRIG 193 (H) 05/18/2021   Lab Results  Component Value Date   CHOLHDL 4.3 05/18/2021   No results found for: HGBA1C     Assessment & Plan:  Problem List Items Addressed This Visit   None Visit Diagnoses     Acute cough    -  Primary   Most likely URI, ongoing for 5 days. Flu negative. Covid-19 pending. Will treat with a z-pak and tessalon prn. Increase rest, fluids. F/U if not improving.   Relevant Orders   Novel Coronavirus, NAA ([facility])   Veritor Flu A/B Waived        Meds ordered this encounter  Medications   benzonatate (TESSALON) 200 MG capsule    Sig: Take 1 capsule (200 mg total) by mouth 3 (three) times daily as needed for cough.    Dispense:  20 capsule    Refill:  0   azithromycin (ZITHROMAX) 250 MG tablet    Sig: Take 2 tablets on day 1, then 1 tablet daily on days 2 through 5    Dispense:  6 tablet    Refill:  0      Charyl Dancer, NP

## 2021-09-21 ENCOUNTER — Ambulatory Visit (INDEPENDENT_AMBULATORY_CARE_PROVIDER_SITE_OTHER): Payer: Medicare Other | Admitting: Nurse Practitioner

## 2021-09-21 ENCOUNTER — Other Ambulatory Visit: Payer: Self-pay

## 2021-09-21 ENCOUNTER — Encounter: Payer: Self-pay | Admitting: Nurse Practitioner

## 2021-09-21 VITALS — BP 131/76 | HR 76 | Temp 97.3°F | Wt 210.6 lb

## 2021-09-21 DIAGNOSIS — R051 Acute cough: Secondary | ICD-10-CM

## 2021-09-21 LAB — VERITOR FLU A/B WAIVED
Influenza A: NEGATIVE
Influenza B: NEGATIVE

## 2021-09-21 MED ORDER — BENZONATATE 200 MG PO CAPS: 200.0000 mg | ORAL_CAPSULE | Freq: Three times a day (TID) | ORAL | 0 refills | Status: DC | PRN

## 2021-09-21 MED ORDER — AZITHROMYCIN 250 MG PO TABS: ORAL_TABLET | ORAL | 0 refills | Status: AC

## 2021-09-21 NOTE — Patient Instructions (Signed)
1/2 peroxide, 1/2 warm water and let it sit in your ears for 10-15 minutes Or  Debrox over the counter, put 5 drops in each and let it sit for 10-15 minutes

## 2021-09-22 LAB — NOVEL CORONAVIRUS, NAA: SARS-CoV-2, NAA: NOT DETECTED

## 2021-09-27 ENCOUNTER — Ambulatory Visit: Payer: Self-pay | Admitting: *Deleted

## 2021-09-27 ENCOUNTER — Telehealth: Payer: Self-pay | Admitting: Internal Medicine

## 2021-09-27 NOTE — Telephone Encounter (Signed)
Called patient to follow up on concern with coughing/congestion and to offer an appointment.  I was able to schedule him with NP Lauren for this concern.  Additional concern from patient-  He was very verbal that he does not want to be seen by his PCP (Dr. Neomia Dear) any longer and wants someone else to be his PCP. He has asked to be contacted at [phone].

## 2021-09-27 NOTE — Telephone Encounter (Signed)
  Chief Complaint: lingering cough after treatment Symptoms: cough-productive, congestion Frequency: since 11/27 Pertinent Negatives: Patient denies fever, SOB Disposition: [] ED /[] Urgent Care (no appt availability in office / [] Appointment(In office/virtual)/ []   Virtual Care/ [] Home Care   Patient was seen in office 12/1 and treated with antibiotic and cough medication. Patient states antibiotic "took edge off" but cough medication did not help at all. Patient has finished treatment and is still coughing with chest congestion. Patient wants to know next steps.

## 2021-09-27 NOTE — Telephone Encounter (Signed)
Virtual appointment scheduled 12/08.  Please see notes below.

## 2021-09-27 NOTE — Telephone Encounter (Signed)
Patient called in to inform the Dr that he took all his medication prescribed but that the cough is still lingering and he is still very stuffy in the head need to know if there is something else he can get please advise and call Ph# [phone]  Reason for Disposition  [1] Continuous (nonstop) coughing interferes with work or school AND [2] no improvement using cough treatment per Care Advice  Answer Assessment - Initial Assessment Questions 1. ONSET: "When did the cough begin?"      11/27-started with sore throat-  2. SEVERITY: "How bad is the cough today?"      Still coughing with phlegm  3. SPUTUM: "Describe the color of your sputum" (none, dry cough; clear, white, yellow, green)     Yellow- mostly clear 4. HEMOPTYSIS: "Are you coughing up any blood?" If so ask: "How much?" (flecks, streaks, tablespoons, etc.)     no 5. DIFFICULTY BREATHING: "Are you having difficulty breathing?" If Yes, ask: "How bad is it?" (e.g., mild, moderate, severe)    - MILD: No SOB at rest, mild SOB with walking, speaks normally in sentences, can lie down, no retractions, pulse < 100.    - MODERATE: SOB at rest, SOB with minimal exertion and prefers to sit, cannot lie down flat, speaks in phrases, mild retractions, audible wheezing, pulse 100-120.    - SEVERE: Very SOB at rest, speaks in single words, struggling to breathe, sitting hunched forward, retractions, pulse > 120      normal 6. FEVER: "Do you have a fever?" If Yes, ask: "What is your temperature, how was it measured, and when did it start?"     no 7. CARDIAC HISTORY: "Do you have any history of heart disease?" (e.g., heart attack, congestive heart failure)      no 8. LUNG HISTORY: "Do you have any history of lung disease?"  (e.g., pulmonary embolus, asthma, emphysema)     no 9. PE RISK FACTORS: "Do you have a history of blood clots?" (or: recent major surgery, recent prolonged travel, bedridden)     no 10. OTHER SYMPTOMS: "Do you have any other  symptoms?" (e.g., runny nose, wheezing, chest pain)       Cough and congestion 11. PREGNANCY: "Is there any chance you are pregnant?" "When was your last menstrual period?"       na 12. TRAVEL: "Have you traveled out of the country in the last month?" (e.g., travel history, exposures)       na  Protocols used: Cough - Acute Productive-A-AH

## 2021-09-28 ENCOUNTER — Telehealth: Payer: Medicare Other | Admitting: Nurse Practitioner

## 2021-09-28 NOTE — Telephone Encounter (Signed)
Noted  

## 2021-10-13 NOTE — Telephone Encounter (Signed)
[facility] requesting patient call me back directly at [phone] to discuss request to change PCP.

## 2021-11-03 DIAGNOSIS — L720 Epidermal cyst: Secondary | ICD-10-CM | POA: Diagnosis not present

## 2021-11-16 DIAGNOSIS — L728 Other follicular cysts of the skin and subcutaneous tissue: Secondary | ICD-10-CM | POA: Diagnosis not present

## 2021-11-16 DIAGNOSIS — L570 Actinic keratosis: Secondary | ICD-10-CM | POA: Diagnosis not present

## 2021-11-16 DIAGNOSIS — L821 Other seborrheic keratosis: Secondary | ICD-10-CM | POA: Diagnosis not present

## 2021-11-16 DIAGNOSIS — L538 Other specified erythematous conditions: Secondary | ICD-10-CM | POA: Diagnosis not present

## 2021-11-16 DIAGNOSIS — R208 Other disturbances of skin sensation: Secondary | ICD-10-CM | POA: Diagnosis not present

## 2021-12-06 ENCOUNTER — Ambulatory Visit (INDEPENDENT_AMBULATORY_CARE_PROVIDER_SITE_OTHER): Payer: Medicare Other | Admitting: Nurse Practitioner

## 2021-12-06 ENCOUNTER — Other Ambulatory Visit: Payer: Self-pay

## 2021-12-06 ENCOUNTER — Encounter: Payer: Self-pay | Admitting: Nurse Practitioner

## 2021-12-06 VITALS — BP 128/75 | HR 91 | Temp 98.8°F | Wt 209.0 lb

## 2021-12-06 DIAGNOSIS — J069 Acute upper respiratory infection, unspecified: Secondary | ICD-10-CM

## 2021-12-06 DIAGNOSIS — R051 Acute cough: Secondary | ICD-10-CM

## 2021-12-06 LAB — VERITOR FLU A/B WAIVED
Influenza A: NEGATIVE
Influenza B: NEGATIVE

## 2021-12-06 MED ORDER — AZITHROMYCIN 250 MG PO TABS: ORAL_TABLET | ORAL | 0 refills | Status: AC

## 2021-12-06 MED ORDER — METHYLPREDNISOLONE 4 MG PO TBPK: ORAL_TABLET | ORAL | 0 refills | Status: DC

## 2021-12-06 MED ORDER — GUAIFENESIN-CODEINE 100-10 MG/5ML PO SOLN: 5.0000 mL | Freq: Two times a day (BID) | ORAL | 0 refills | Status: DC | PRN

## 2021-12-06 NOTE — Progress Notes (Signed)
BP 128/75    Pulse 91    Temp 98.8 F (37.1 C) (Oral)    Wt 209 lb (94.8 kg)    SpO2 98%    BMI 29.26 kg/m    Subjective:    Patient ID: Dennis Byrd, male    DOB: [birth year], 71 y.o.   MRN: [id number]  HPI: Dennis Byrd is a 71 y.o. male  Chief Complaint  Patient presents with   URI    Pt states he has been having a cough, congestion, sore throat, body aches, chills, and headache since Sunday evening.    UPPER RESPIRATORY TRACT INFECTION Worst symptom: Symptoms started on Sunday Fever:  has had a low grade fever Cough: yes Shortness of breath: no Wheezing: no Chest pain: no Chest tightness: no Chest congestion: yes Nasal congestion: yes Runny nose: yes Post nasal drip: yes Sneezing: yes Sore throat: yes Swollen glands: no Sinus pressure: no Headache: no Face pain: no Toothache: no Ear pain: no bilateral Ear pressure: no bilateral Eyes red/itching:no Eye drainage/crusting: no  Vomiting: no Rash: no Fatigue: no Sick contacts: no Strep contacts: no  Context: stable Recurrent sinusitis: no Relief with OTC cold/cough medications: no  Treatments attempted: mucinex and anti-histamine   Relevant past medical, surgical, family and social history reviewed and updated as indicated. Interim medical history since our last visit reviewed. Allergies and medications reviewed and updated.  Review of Systems  Constitutional:  Positive for fever. Negative for fatigue.  HENT:  Positive for congestion, postnasal drip and rhinorrhea. Negative for ear pain, sinus pressure, sinus pain, sneezing and sore throat.   Respiratory:  Positive for cough. Negative for chest tightness, shortness of breath and wheezing.   Gastrointestinal:  Negative for vomiting.  Skin:  Negative for rash.  Neurological:  Negative for headaches.   Per HPI unless specifically indicated above     Objective:    BP 128/75    Pulse 91    Temp 98.8 F (37.1 C) (Oral)    Wt 209 lb (94.8 kg)    SpO2 98%     BMI 29.26 kg/m   Wt Readings from Last 3 Encounters:  12/06/21 209 lb (94.8 kg)  09/21/21 210 lb 9.6 oz (95.5 kg)  09/07/21 211 lb 12.8 oz (96.1 kg)    Physical Exam Vitals and nursing note reviewed.  Constitutional:      General: He is not in acute distress.    Appearance: Normal appearance. He is not ill-appearing, toxic-appearing or diaphoretic.  HENT:     Head: Normocephalic.     Right Ear: Tympanic membrane and external ear normal.     Left Ear: Tympanic membrane and external ear normal.     Nose: Congestion and rhinorrhea present.     Mouth/Throat:     Mouth: Mucous membranes are moist.     Pharynx: Posterior oropharyngeal erythema present. No oropharyngeal exudate.  Eyes:     General:        Right eye: No discharge.        Left eye: No discharge.     Extraocular Movements: Extraocular movements intact.     Conjunctiva/sclera: Conjunctivae normal.     Pupils: Pupils are equal, round, and reactive to light.  Cardiovascular:     Rate and Rhythm: Normal rate and regular rhythm.     Heart sounds: No murmur heard. Pulmonary:     Effort: Pulmonary effort is normal. No respiratory distress.     Breath sounds: Normal breath sounds. No wheezing,  rhonchi or rales.  Abdominal:     General: Abdomen is flat. Bowel sounds are normal.  Musculoskeletal:     Cervical back: Normal range of motion and neck supple.  Skin:    General: Skin is warm and dry.     Capillary Refill: Capillary refill takes less than 2 seconds.  Neurological:     General: No focal deficit present.     Mental Status: He is alert and oriented to person, place, and time.  Psychiatric:        Mood and Affect: Mood normal.        Behavior: Behavior normal.        Thought Content: Thought content normal.        Judgment: Judgment normal.    Results for orders placed or performed in visit on 12/06/21  Veritor Flu A/B Waived  Result Value Ref Range   Influenza A Negative Negative   Influenza B Negative  Negative      Assessment & Plan:   Problem List Items Addressed This Visit   None Visit Diagnoses     Viral upper respiratory tract infection    -  Primary   Will treat with prednisone and Zpak. Negative for flu in office. Cough medicine with codeine given. FU if symptoms do not improve.   Relevant Medications   azithromycin (ZITHROMAX) 250 MG tablet   Acute cough       Relevant Orders   Novel Coronavirus, NAA ([facility])   Veritor Flu A/B Waived (Completed)        Follow up plan: Return if symptoms worsen or fail to improve.

## 2021-12-06 NOTE — Progress Notes (Signed)
Results discussed with patient during visit.

## 2021-12-08 ENCOUNTER — Telehealth: Payer: Self-pay | Admitting: Internal Medicine

## 2021-12-08 LAB — NOVEL CORONAVIRUS, NAA: SARS-CoV-2, NAA: NOT DETECTED

## 2021-12-08 NOTE — Telephone Encounter (Signed)
Pt called back and verbalized understanding of his covid 19 results

## 2021-12-08 NOTE — Progress Notes (Signed)
Please let patient know that his COVID test was negative.

## 2022-01-05 DIAGNOSIS — R3912 Poor urinary stream: Secondary | ICD-10-CM | POA: Diagnosis not present

## 2022-02-07 ENCOUNTER — Ambulatory Visit: Payer: Self-pay | Admitting: *Deleted

## 2022-02-07 NOTE — Telephone Encounter (Signed)
Summary: Unusual urination  ? Pt is experiencing bladder discomfort, frequently urinating and is feeling a pressure in that area. Lasting for about a week.   ?  ? ?Chief Complaint: freq urinary ?Symptoms: frequent urination ?Frequency: very frequent ?Pertinent Negatives: Patient denies fever ?Disposition: '[]'$ ED /'[]'$ Urgent Care (no appt availability in office) / '[x]'$ Appointment(In office/virtual)/ '[]'$  [facility]/ '[]'$ Home Care/ '[]'$ Refused Recommended Disposition /'[]'$ [facility]/ '[]'$  Follow-up with PCP ?Additional Notes: Appt with Dr. Wynetta Emery in the morning. Pt states AZO helped for a while. ? ?Reason for Disposition ? Urinating more frequently than usual (i.e., frequency) ? ?Answer Assessment - Initial Assessment Questions ?1. SYMPTOM: "What's the main symptom you're concerned about?" (e.g., frequency, incontinence) ?    Frequent urination ?2. ONSET: "When did the    start?" ?    A week or so ?3. PAIN: "Is there any pain?" If Yes, ask: "How bad is it?" (Scale: 1-10; mild, moderate, severe) ?    A little pain ?4. CAUSE: "What do you think is causing the symptoms?" ?    Unsure, had UTI before ?5. OTHER SYMPTOMS: "Do you have any other symptoms?" (e.g., fever, flank pain, blood in urine, pain with urination) ?    no ?6. PREGNANCY: "Is there any chance you are pregnant?" "When was your last menstrual period?" ?    na ? ?Protocols used: Urinary Symptoms-A-AH ? ?

## 2022-02-08 ENCOUNTER — Ambulatory Visit (INDEPENDENT_AMBULATORY_CARE_PROVIDER_SITE_OTHER): Payer: Medicare Other | Admitting: Family Medicine

## 2022-02-08 ENCOUNTER — Ambulatory Visit: Payer: Medicare Other | Admitting: Internal Medicine

## 2022-02-08 ENCOUNTER — Encounter: Payer: Self-pay | Admitting: Family Medicine

## 2022-02-08 VITALS — BP 136/79 | HR 79 | Temp 98.1°F | Wt 211.8 lb

## 2022-02-08 DIAGNOSIS — Z13220 Encounter for screening for lipoid disorders: Secondary | ICD-10-CM | POA: Diagnosis not present

## 2022-02-08 DIAGNOSIS — R8281 Pyuria: Secondary | ICD-10-CM | POA: Diagnosis not present

## 2022-02-08 DIAGNOSIS — R350 Frequency of micturition: Secondary | ICD-10-CM | POA: Diagnosis not present

## 2022-02-08 DIAGNOSIS — Z136 Encounter for screening for cardiovascular disorders: Secondary | ICD-10-CM | POA: Diagnosis not present

## 2022-02-08 LAB — URINALYSIS, ROUTINE W REFLEX MICROSCOPIC
Bilirubin, UA: NEGATIVE
Glucose, UA: NEGATIVE
Ketones, UA: NEGATIVE
Nitrite, UA: NEGATIVE
Protein,UA: NEGATIVE
Specific Gravity, UA: 1.015 (ref 1.005–1.030)
Urobilinogen, Ur: 0.2 mg/dL (ref 0.2–1.0)
pH, UA: 6 (ref 5.0–7.5)

## 2022-02-08 LAB — MICROSCOPIC EXAMINATION
Bacteria, UA: NONE SEEN
Epithelial Cells (non renal): NONE SEEN /hpf (ref 0–10)

## 2022-02-08 LAB — BAYER DCA HB A1C WAIVED: HB A1C (BAYER DCA - WAIVED): 5.2 % (ref 4.8–5.6)

## 2022-02-08 MED ORDER — FLUCONAZOLE 100 MG PO TABS: 100.0000 mg | ORAL_TABLET | Freq: Every day | ORAL | 0 refills | Status: DC

## 2022-02-08 NOTE — Progress Notes (Signed)
? ?BP 136/79   Pulse 79   Temp 98.1 ?F (36.7 ?C)   Wt 211 lb 12.8 oz (96.1 kg)   SpO2 98%   BMI 29.65 kg/m?   ? ?Subjective:  ? ? Patient ID: Dennis Byrd, male    DOB: 03-29-1951, 71 y.o.   MRN: [id number] ? ?HPI: ?Dennis Byrd is a 71 y.o. male ? ?Chief Complaint  ?Patient presents with  ? Urinary Frequency  ?  Patient states he has been having urinary frequency for about 2 weeks.  ? ?URINARY SYMPTOMS ?Duration: 7-10 days  ?Dysuria: no ?Urinary frequency: yes ?Urgency: no ?Small volume voids: no ?Symptom severity: moderate ?Urinary incontinence: no ?Foul odor: no ?Hematuria: no ?Abdominal pain: no ?Back pain: no ?Suprapubic pain/pressure: no ?Flank pain: no ?Fever:  no ?Vomiting: no ?Status: new onset ?Previous urinary tract infection: yes ?Recurrent urinary tract infection: no ?History of sexually transmitted disease: no ?Penile discharge: no ?Treatments attempted: increasing fluids  ? ?Relevant past medical, surgical, family and social history reviewed and updated as indicated. Interim medical history since our last visit reviewed. ?Allergies and medications reviewed and updated. ? ?Review of Systems  ?Constitutional: Negative.   ?Respiratory: Negative.    ?Cardiovascular: Negative.   ?Gastrointestinal: Negative.   ?Genitourinary:  Positive for frequency and urgency. Negative for decreased urine volume, difficulty urinating, dysuria, enuresis, flank pain, genital sores, hematuria, penile discharge, penile pain, penile swelling, scrotal swelling and testicular pain.  ?Musculoskeletal: Negative.   ?Psychiatric/Behavioral: Negative.    ? ?Per HPI unless specifically indicated above ? ?   ?Objective:  ?  ?BP 136/79   Pulse 79   Temp 98.1 ?F (36.7 ?C)   Wt 211 lb 12.8 oz (96.1 kg)   SpO2 98%   BMI 29.65 kg/m?   ?Wt Readings from Last 3 Encounters:  ?02/08/22 211 lb 12.8 oz (96.1 kg)  ?12/06/21 209 lb (94.8 kg)  ?09/21/21 210 lb 9.6 oz (95.5 kg)  ?  ?Physical Exam ?Vitals and nursing note reviewed.   ?Constitutional:   ?   General: He is not in acute distress. ?   Appearance: Normal appearance. He is not ill-appearing, toxic-appearing or diaphoretic.  ?HENT:  ?   Head: Normocephalic and atraumatic.  ?   Right Ear: External ear normal.  ?   Left Ear: External ear normal.  ?   Nose: Nose normal.  ?   Mouth/Throat:  ?   Mouth: Mucous membranes are moist.  ?   Pharynx: Oropharynx is clear.  ?Eyes:  ?   General: No scleral icterus.    ?   Right eye: No discharge.     ?   Left eye: No discharge.  ?   Extraocular Movements: Extraocular movements intact.  ?   Conjunctiva/sclera: Conjunctivae normal.  ?   Pupils: Pupils are equal, round, and reactive to light.  ?Cardiovascular:  ?   Rate and Rhythm: Normal rate and regular rhythm.  ?   Pulses: Normal pulses.  ?   Heart sounds: Normal heart sounds. No murmur heard. ?  No friction rub. No gallop.  ?Pulmonary:  ?   Effort: Pulmonary effort is normal. No respiratory distress.  ?   Breath sounds: Normal breath sounds. No stridor. No wheezing, rhonchi or rales.  ?Chest:  ?   Chest wall: No tenderness.  ?Abdominal:  ?   General: Abdomen is flat. Bowel sounds are normal. There is no distension.  ?   Palpations: Abdomen is soft. There is no mass.  ?  Tenderness: There is no abdominal tenderness. There is no right CVA tenderness, left CVA tenderness, guarding or rebound.  ?   Hernia: No hernia is present.  ?Musculoskeletal:     ?   General: Normal range of motion.  ?   Cervical back: Normal range of motion and neck supple.  ?Skin: ?   General: Skin is warm and dry.  ?   Capillary Refill: Capillary refill takes less than 2 seconds.  ?   Coloration: Skin is not jaundiced or pale.  ?   Findings: No bruising, erythema, lesion or rash.  ?Neurological:  ?   General: No focal deficit present.  ?   Mental Status: He is alert and oriented to person, place, and time. Mental status is at baseline.  ?Psychiatric:     ?   Mood and Affect: Mood normal.     ?   Behavior: Behavior normal.     ?    Thought Content: Thought content normal.     ?   Judgment: Judgment normal.  ? ? ?Results for orders placed or performed in visit on 02/08/22  ?Microscopic Examination  ? Urine  ?Result Value Ref Range  ? WBC, UA 0-5 0 - 5 /hpf  ? RBC 0-2 0 - 2 /hpf  ? Epithelial Cells (non renal) None seen 0 - 10 /hpf  ? Bacteria, UA None seen None seen/Few  ?Urinalysis, Routine w reflex microscopic  ?Result Value Ref Range  ? Specific Gravity, UA 1.015 1.005 - 1.030  ? pH, UA 6.0 5.0 - 7.5  ? Color, UA Yellow Yellow  ? Appearance Ur Clear Clear  ? Leukocytes,UA Trace (A) Negative  ? Protein,UA Negative Negative/Trace  ? Glucose, UA Negative Negative  ? Ketones, UA Negative Negative  ? RBC, UA Trace (A) Negative  ? Bilirubin, UA Negative Negative  ? Urobilinogen, Ur 0.2 0.2 - 1.0 mg/dL  ? Nitrite, UA Negative Negative  ? Microscopic Examination See below:   ?Bayer DCA Hb A1c Waived  ?Result Value Ref Range  ? HB A1C (BAYER DCA - WAIVED) 5.2 4.8 - 5.6 %  ? ?   ?Assessment & Plan:  ? ?Problem List Items Addressed This Visit   ?None ?Visit Diagnoses   ? ? Urinary frequency    -  Primary  ? Trace leuks. Will await culture. Concern for yeast based on symptoms will treat with diflucan while waiting on culture. Call with any concerns.   ? Relevant Orders  ? Urinalysis, Routine w reflex microscopic (Completed)  ? PSA  ? CBC with Differential/Platelet  ? Bayer DCA Hb A1c Waived (Completed)  ? Comprehensive metabolic panel  ? TSH  ? Pyuria      ? Will send for culture. Await results.   ? Relevant Orders  ? Urine Culture  ? Screening for cholesterol level      ? Labs drawn today. Await results.   ? Relevant Orders  ? Lipid Panel w/o Chol/HDL Ratio  ? ?  ?  ? ?Follow up plan: ?Return if symptoms worsen or fail to improve. ? ? ? ? ? ?

## 2022-02-09 LAB — CBC WITH DIFFERENTIAL/PLATELET
Basophils Absolute: 0 10*3/uL (ref 0.0–0.2)
Basos: 0 %
EOS (ABSOLUTE): 0.2 10*3/uL (ref 0.0–0.4)
Eos: 2 %
Hematocrit: 46 % (ref 37.5–51.0)
Hemoglobin: 16.3 g/dL (ref 13.0–17.7)
Immature Grans (Abs): 0 10*3/uL (ref 0.0–0.1)
Immature Granulocytes: 0 %
Lymphocytes Absolute: 1.8 10*3/uL (ref 0.7–3.1)
Lymphs: 21 %
MCH: 32 pg (ref 26.6–33.0)
MCHC: 35.4 g/dL (ref 31.5–35.7)
MCV: 90 fL (ref 79–97)
Monocytes Absolute: 0.5 10*3/uL (ref 0.1–0.9)
Monocytes: 6 %
Neutrophils Absolute: 5.9 10*3/uL (ref 1.4–7.0)
Neutrophils: 71 %
Platelets: 242 10*3/uL (ref 150–450)
RBC: 5.09 x10E6/uL (ref 4.14–5.80)
RDW: 13.2 % (ref 11.6–15.4)
WBC: 8.4 10*3/uL (ref 3.4–10.8)

## 2022-02-09 LAB — LIPID PANEL W/O CHOL/HDL RATIO
Cholesterol, Total: 130 mg/dL (ref 100–199)
HDL: 33 mg/dL — ABNORMAL LOW (ref >39)
LDL Chol Calc (NIH): 63 mg/dL (ref 0–99)
Triglycerides: 203 mg/dL — ABNORMAL HIGH (ref 0–149)
VLDL Cholesterol Cal: 34 mg/dL (ref 5–40)

## 2022-02-09 LAB — COMPREHENSIVE METABOLIC PANEL
ALT: 15 IU/L (ref 0–44)
AST: 17 IU/L (ref 0–40)
Albumin/Globulin Ratio: 2 (ref 1.2–2.2)
Albumin: 4.3 g/dL (ref 3.7–4.7)
Alkaline Phosphatase: 65 IU/L (ref 44–121)
BUN/Creatinine Ratio: 16 (ref 10–24)
BUN: 19 mg/dL (ref 8–27)
Bilirubin Total: 0.5 mg/dL (ref 0.0–1.2)
CO2: 21 mmol/L (ref 20–29)
Calcium: 9.3 mg/dL (ref 8.6–10.2)
Chloride: 104 mmol/L (ref 96–106)
Creatinine, Ser: 1.2 mg/dL (ref 0.76–1.27)
Globulin, Total: 2.1 g/dL (ref 1.5–4.5)
Glucose: 104 mg/dL — ABNORMAL HIGH (ref 70–99)
Potassium: 4.3 mmol/L (ref 3.5–5.2)
Sodium: 140 mmol/L (ref 134–144)
Total Protein: 6.4 g/dL (ref 6.0–8.5)
eGFR: 65 mL/min/{1.73_m2} (ref >59)

## 2022-02-09 LAB — PSA: Prostate Specific Ag, Serum: 4 ng/mL (ref 0.0–4.0)

## 2022-02-09 LAB — TSH: TSH: 0.638 u[IU]/mL (ref 0.450–4.500)

## 2022-04-27 ENCOUNTER — Ambulatory Visit: Payer: Self-pay

## 2022-04-27 NOTE — Telephone Encounter (Signed)
Pt is calling to ask if there is any documentation of what his blood type is. Please advise    Pt. Asking what his blood type is. Not noted in chart or in past lab work. Pt. Verbalizes understanding. Answer Assessment - Initial Assessment Questions 1. REASON FOR CALL or QUESTION: "What is your reason for calling today?" or "How can I best help you?" or "What question do you have that I can help answer?"     Asking if his blood type is in his chart 2. CALLER: Document the source of call. (e.g., laboratory, patient).     Patient  Protocols used: PCP Call - No Triage-A-AH

## 2022-05-16 DIAGNOSIS — L821 Other seborrheic keratosis: Secondary | ICD-10-CM | POA: Diagnosis not present

## 2022-05-16 DIAGNOSIS — D492 Neoplasm of unspecified behavior of bone, soft tissue, and skin: Secondary | ICD-10-CM | POA: Diagnosis not present

## 2022-05-16 DIAGNOSIS — D225 Melanocytic nevi of trunk: Secondary | ICD-10-CM | POA: Diagnosis not present

## 2022-05-16 DIAGNOSIS — M71342 Other bursal cyst, left hand: Secondary | ICD-10-CM | POA: Diagnosis not present

## 2022-05-16 DIAGNOSIS — L814 Other melanin hyperpigmentation: Secondary | ICD-10-CM | POA: Diagnosis not present

## 2022-05-16 DIAGNOSIS — L570 Actinic keratosis: Secondary | ICD-10-CM | POA: Diagnosis not present

## 2022-05-16 DIAGNOSIS — R58 Hemorrhage, not elsewhere classified: Secondary | ICD-10-CM | POA: Diagnosis not present

## 2022-09-20 ENCOUNTER — Telehealth: Payer: Self-pay

## 2022-09-20 NOTE — Telephone Encounter (Signed)
Copied from [place] [phone]. Topic: General - Other >> Sep 20, 2022 11:56 AM [place] B wrote: Reason for CRM:  Patient called in wants to have a psa test and shingles shot if possible and wants to know what his blood type is and needs his handicapped form signed.

## 2022-09-20 NOTE — Telephone Encounter (Signed)
Patient is overdue for an appointment. Has not been seen Since April. Please call and schedule a follow up appointment. Requests can be addressed at the visit.

## 2022-09-27 ENCOUNTER — Ambulatory Visit: Payer: Medicare Other | Admitting: Family Medicine

## 2022-10-01 ENCOUNTER — Encounter: Payer: Self-pay | Admitting: Family Medicine

## 2022-10-01 ENCOUNTER — Ambulatory Visit (INDEPENDENT_AMBULATORY_CARE_PROVIDER_SITE_OTHER): Payer: Medicare Other | Admitting: Family Medicine

## 2022-10-01 VITALS — BP 122/66 | HR 64 | Temp 98.0°F | Wt 209.7 lb

## 2022-10-01 DIAGNOSIS — Z0000 Encounter for general adult medical examination without abnormal findings: Secondary | ICD-10-CM | POA: Diagnosis not present

## 2022-10-01 DIAGNOSIS — E785 Hyperlipidemia, unspecified: Secondary | ICD-10-CM | POA: Diagnosis not present

## 2022-10-01 DIAGNOSIS — R030 Elevated blood-pressure reading, without diagnosis of hypertension: Secondary | ICD-10-CM

## 2022-10-01 DIAGNOSIS — Z23 Encounter for immunization: Secondary | ICD-10-CM

## 2022-10-01 DIAGNOSIS — R3911 Hesitancy of micturition: Secondary | ICD-10-CM | POA: Diagnosis not present

## 2022-10-01 DIAGNOSIS — Z1159 Encounter for screening for other viral diseases: Secondary | ICD-10-CM | POA: Diagnosis not present

## 2022-10-01 DIAGNOSIS — Z1211 Encounter for screening for malignant neoplasm of colon: Secondary | ICD-10-CM

## 2022-10-01 DIAGNOSIS — R7989 Other specified abnormal findings of blood chemistry: Secondary | ICD-10-CM | POA: Diagnosis not present

## 2022-10-01 NOTE — Progress Notes (Signed)
BP 122/66   Pulse 64   Temp 98 F (36.7 C) (Oral)   Wt 209 lb 11.2 oz (95.1 kg)   SpO2 98%   BMI 29.36 kg/m    Subjective:    Patient ID: Dennis Byrd, male    DOB: [birth year], 71 y.o.   MRN: [id number]  HPI: Dennis Byrd is a 71 y.o. male presenting on 10/01/2022 for comprehensive medical examination. Current medical complaints include:none  Interim Problems from his last visit: no  Functional Status Survey: Is the patient deaf or have difficulty hearing?: No Does the patient have difficulty seeing, even when wearing glasses/contacts?: No Does the patient have difficulty concentrating, remembering, or making decisions?: No Does the patient have difficulty walking or climbing stairs?: No Does the patient have difficulty dressing or bathing?: No Does the patient have difficulty doing errands alone such as visiting a doctor's office or shopping?: No  FALL RISK:    10/01/2022    3:22 PM 09/07/2021    1:06 PM 06/01/2021    1:41 PM 05/18/2021    2:41 PM 05/04/2016    8:32 AM  Rosine in the past year? 0 0 0 0 No  Number falls in past yr: 0 0 0 0   Injury with Fall? 0 0 0 0   Risk for fall due to : No Fall Risks No Fall Risks No Fall Risks No Fall Risks   Follow up Falls evaluation completed Falls evaluation completed Falls evaluation completed Falls evaluation completed     Depression Screen    10/01/2022    3:22 PM 09/21/2021   11:13 AM 09/07/2021    1:06 PM 05/18/2021    2:42 PM 05/04/2016    8:32 AM  Depression screen PHQ 2/9  Decreased Interest 0 0 0 0 0  Down, Depressed, Hopeless 0 0 0 0 0  PHQ - 2 Score 0 0 0 0 0  Altered sleeping 0 0 0 0   Tired, decreased energy 0 0 0 0   Change in appetite 0 0 0 0   Feeling bad or failure about yourself  0 0 0 0   Trouble concentrating 0 0 0 0   Moving slowly or fidgety/restless 0 0 0 0   Suicidal thoughts 0 0 0 0   PHQ-9 Score 0 0 0 0   Difficult doing work/chores Not difficult at all Not difficult at all Not  difficult at all      Advanced Directives <no information>  Past Medical History:  History reviewed. No pertinent past medical history.  Surgical History:  Past Surgical History:  Procedure Laterality Date   CYST EXCISION     HERNIA REPAIR      Medications:  Current Outpatient Medications on File Prior to Visit  Medication Sig   Omega-3 Fatty Acids (FISH OIL) 1000 MG CAPS Take 1 capsule by mouth daily.   No current facility-administered medications on file prior to visit.    Allergies:  No Known Allergies  Social History:  Social History   Socioeconomic History   Marital status: Married    Spouse name: Not on file   Number of children: Not on file   Years of education: Not on file   Highest education level: Not on file  Occupational History   Not on file  Tobacco Use   Smoking status: Never   Smokeless tobacco: Never  Vaping Use   Vaping Use: Never used  Substance and Sexual Activity  Alcohol use: No    Alcohol/week: 0.0 standard drinks of alcohol   Drug use: No   Sexual activity: Yes  Other Topics Concern   Not on file  Social History Narrative   ** Merged History Encounter **       Social Determinants of Health   Financial Resource Strain: Low Risk  (09/12/2021)   Overall Financial Resource Strain (CARDIA)    Difficulty of Paying Living Expenses: Not hard at all  Food Insecurity: Not on file  Transportation Needs: Not on file  Physical Activity: Not on file  Stress: No Stress Concern Present (09/12/2021)   Mitchell of Stress : Not at all  Social Connections: Moderately Integrated (09/12/2021)   Social Connection and Isolation Panel [NHANES]    Frequency of Communication with Friends and Family: More than three times a week    Frequency of Social Gatherings with Friends and Family: More than three times a week    Attends Religious Services: More than 4 times per year     Active Member of [employer] or Organizations: No    Attends Archivist Meetings: Never    Marital Status: Living with partner  Intimate Partner Violence: Not on file   Social History   Tobacco Use  Smoking Status Never  Smokeless Tobacco Never   Social History   Substance and Sexual Activity  Alcohol Use No   Alcohol/week: 0.0 standard drinks of alcohol    Family History:  History reviewed. No pertinent family history.  Past medical history, surgical history, medications, allergies, family history and social history reviewed with patient today and changes made to appropriate areas of the chart.   Review of Systems  Constitutional:  Positive for malaise/fatigue. Negative for chills, diaphoresis, fever and weight loss.  HENT: Negative.    Eyes: Negative.   Respiratory: Negative.    Cardiovascular:  Positive for palpitations. Negative for chest pain, orthopnea, claudication, leg swelling and PND.  Gastrointestinal:  Positive for heartburn. Negative for abdominal pain, blood in stool, constipation, diarrhea, melena, nausea and vomiting.  Genitourinary: Negative.   Musculoskeletal:  Positive for back pain. Negative for falls, joint pain, myalgias and neck pain.  Skin: Negative.   Neurological: Negative.   Endo/Heme/Allergies: Negative.   Psychiatric/Behavioral: Negative.     All other ROS negative except what is listed above and in the HPI.      Objective:    BP 122/66   Pulse 64   Temp 98 F (36.7 C) (Oral)   Wt 209 lb 11.2 oz (95.1 kg)   SpO2 98%   BMI 29.36 kg/m   Wt Readings from Last 3 Encounters:  10/01/22 209 lb 11.2 oz (95.1 kg)  02/08/22 211 lb 12.8 oz (96.1 kg)  12/06/21 209 lb (94.8 kg)   Physical Exam Vitals and nursing note reviewed.  Constitutional:      General: He is not in acute distress.    Appearance: Normal appearance. He is not ill-appearing, toxic-appearing or diaphoretic.  HENT:     Head: Normocephalic and atraumatic.     Right  Ear: Tympanic membrane, ear canal and external ear normal. There is no impacted cerumen.     Left Ear: Tympanic membrane, ear canal and external ear normal. There is no impacted cerumen.     Nose: Nose normal. No congestion or rhinorrhea.     Mouth/Throat:     Mouth: Mucous membranes are moist.  Pharynx: Oropharynx is clear. No oropharyngeal exudate or posterior oropharyngeal erythema.  Eyes:     General: No scleral icterus.       Right eye: No discharge.        Left eye: No discharge.     Extraocular Movements: Extraocular movements intact.     Conjunctiva/sclera: Conjunctivae normal.     Pupils: Pupils are equal, round, and reactive to light.  Neck:     Vascular: No carotid bruit.  Cardiovascular:     Rate and Rhythm: Normal rate and regular rhythm.     Pulses: Normal pulses.     Heart sounds: No murmur heard.    No friction rub. No gallop.  Pulmonary:     Effort: Pulmonary effort is normal. No respiratory distress.     Breath sounds: Normal breath sounds. No stridor. No wheezing, rhonchi or rales.  Chest:     Chest wall: No tenderness.  Abdominal:     General: Abdomen is flat. Bowel sounds are normal. There is no distension.     Palpations: Abdomen is soft. There is no mass.     Tenderness: There is no abdominal tenderness. There is no right CVA tenderness, left CVA tenderness, guarding or rebound.     Hernia: No hernia is present.  Genitourinary:    Comments: Genital exam deferred with shared decision making Musculoskeletal:        General: No swelling, tenderness, deformity or signs of injury.     Cervical back: Normal range of motion and neck supple. No rigidity. No muscular tenderness.     Right lower leg: No edema.     Left lower leg: No edema.  Lymphadenopathy:     Cervical: No cervical adenopathy.  Skin:    General: Skin is warm and dry.     Capillary Refill: Capillary refill takes less than 2 seconds.     Coloration: Skin is not jaundiced or pale.      Findings: No bruising, erythema, lesion or rash.  Neurological:     General: No focal deficit present.     Mental Status: He is alert and oriented to person, place, and time.     Cranial Nerves: No cranial nerve deficit.     Sensory: No sensory deficit.     Motor: No weakness.     Coordination: Coordination normal.     Gait: Gait normal.     Deep Tendon Reflexes: Reflexes normal.  Psychiatric:        Mood and Affect: Mood normal.        Behavior: Behavior normal.        Thought Content: Thought content normal.        Judgment: Judgment normal.        10/01/2022    3:56 PM  6CIT Screen  What Year? 0 points  What month? 0 points  What time? 0 points  Count back from 20 0 points  Months in reverse 0 points  Repeat phrase 4 points  Total Score 4 points     Results for orders placed or performed in visit on 02/08/22  Microscopic Examination   Urine  Result Value Ref Range   WBC, UA 0-5 0 - 5 /hpf   RBC, Urine 0-2 0 - 2 /hpf   Epithelial Cells (non renal) None seen 0 - 10 /hpf   Bacteria, UA None seen None seen/Few  Urinalysis, Routine w reflex microscopic  Result Value Ref Range   Specific Gravity, UA 1.015 1.005 -  1.030   pH, UA 6.0 5.0 - 7.5   Color, UA Yellow Yellow   Appearance Ur Clear Clear   Leukocytes,UA Trace (A) Negative   Protein,UA Negative Negative/Trace   Glucose, UA Negative Negative   Ketones, UA Negative Negative   RBC, UA Trace (A) Negative   Bilirubin, UA Negative Negative   Urobilinogen, Ur 0.2 0.2 - 1.0 mg/dL   Nitrite, UA Negative Negative   Microscopic Examination See below:   PSA  Result Value Ref Range   Prostate Specific Ag, Serum 4.0 0.0 - 4.0 ng/mL  CBC with Differential/Platelet  Result Value Ref Range   WBC 8.4 3.4 - 10.8 x10E3/uL   RBC 5.09 4.14 - 5.80 x10E6/uL   Hemoglobin 16.3 13.0 - 17.7 g/dL   Hematocrit 46.0 37.5 - 51.0 %   MCV 90 79 - 97 fL   MCH 32.0 26.6 - 33.0 pg   MCHC 35.4 31.5 - 35.7 g/dL   RDW 13.2 11.6 - 15.4 %    Platelets 242 150 - 450 x10E3/uL   Neutrophils 71 Not Estab. %   Lymphs 21 Not Estab. %   Monocytes 6 Not Estab. %   Eos 2 Not Estab. %   Basos 0 Not Estab. %   Neutrophils Absolute 5.9 1.4 - 7.0 x10E3/uL   Lymphocytes Absolute 1.8 0.7 - 3.1 x10E3/uL   Monocytes Absolute 0.5 0.1 - 0.9 x10E3/uL   EOS (ABSOLUTE) 0.2 0.0 - 0.4 x10E3/uL   Basophils Absolute 0.0 0.0 - 0.2 x10E3/uL   Immature Granulocytes 0 Not Estab. %   Immature Grans (Abs) 0.0 0.0 - 0.1 x10E3/uL  Bayer DCA Hb A1c Waived  Result Value Ref Range   HB A1C (BAYER DCA - WAIVED) 5.2 4.8 - 5.6 %  Comprehensive metabolic panel  Result Value Ref Range   Glucose 104 (H) 70 - 99 mg/dL   BUN 19 8 - 27 mg/dL   Creatinine, Ser 1.20 0.76 - 1.27 mg/dL   eGFR 65 >59 mL/min/1.73   BUN/Creatinine Ratio 16 10 - 24   Sodium 140 134 - 144 mmol/L   Potassium 4.3 3.5 - 5.2 mmol/L   Chloride 104 96 - 106 mmol/L   CO2 21 20 - 29 mmol/L   Calcium 9.3 8.6 - 10.2 mg/dL   Total Protein 6.4 6.0 - 8.5 g/dL   Albumin 4.3 3.7 - 4.7 g/dL   Globulin, Total 2.1 1.5 - 4.5 g/dL   Albumin/Globulin Ratio 2.0 1.2 - 2.2   Bilirubin Total 0.5 0.0 - 1.2 mg/dL   Alkaline Phosphatase 65 44 - 121 IU/L   AST 17 0 - 40 IU/L   ALT 15 0 - 44 IU/L  Lipid Panel w/o Chol/HDL Ratio  Result Value Ref Range   Cholesterol, Total 130 100 - 199 mg/dL   Triglycerides 203 (H) 0 - 149 mg/dL   HDL 33 (L) >39 mg/dL   VLDL Cholesterol Cal 34 5 - 40 mg/dL   LDL Chol Calc (NIH) 63 0 - 99 mg/dL  TSH  Result Value Ref Range   TSH 0.638 0.450 - 4.500 uIU/mL      Assessment & Plan:   Problem List Items Addressed This Visit       Other   Hyperlipidemia    Rechecking labs today. Await results. Treat as needed.       Relevant Orders   Comprehensive metabolic panel   CBC with Differential/Platelet   Lipid Panel w/o Chol/HDL Ratio   Other Visit Diagnoses  Encounter for Medicare annual wellness exam    -  Primary   Preventative care discussed as below.    Routine general medical examination at a health care facility       Vaccines updated/declined. Screening labs checked today. Cologuard ordered. Continue diet and exercise. Call with any concerns.   Elevated LFTs       Labs drawn today. Await results.   Relevant Orders   Comprehensive metabolic panel   CBC with Differential/Platelet   Elevated blood pressure reading without diagnosis of hypertension       Better on recheck. Will check microalbumin. Call with any concerns.   Relevant Orders   CBC with Differential/Platelet   TSH   Urinalysis, Routine w reflex microscopic   Urine Microalbumin w/creat. ratio   Need for vaccination against Streptococcus pneumoniae       Relevant Orders   Pneumococcal conjugate vaccine 20-valent (Prevnar 20) (Completed)   Hesitancy       Labs drawn today. Await results.   Relevant Orders   PSA   Need for hepatitis C screening test       Labs drawn today. Await results.   Relevant Orders   Hepatitis C Antibody   Screening for colon cancer       Cologuard ordered today.   Relevant Orders   Cologuard        Preventative Services:  Health Risk Assessment and Personalized Prevention Plan: done today Bone Mass Measurements: N/A CVD Screening: Done today Colon Cancer Screening: ordered today Depression Screening: done today Diabetes Screening: done today Glaucoma Screening: See your eye doctor Hepatitis B vaccine: N/A Hepatitis C screening: Done today HIV Screening: up to date Flu Vaccine: up to date Lung cancer Screening: N/A Obesity Screening: Done today Pneumonia Vaccines: done today STI Screening: N/A PSA screening: Done today  LABORATORY TESTING:  Health maintenance labs ordered today as discussed above.   The natural history of prostate cancer and ongoing controversy regarding screening and potential treatment outcomes of prostate cancer has been discussed with the patient. The meaning of a false positive PSA and a false negative PSA  has been discussed. He indicates understanding of the limitations of this screening test and wishes to proceed with screening PSA testing.   IMMUNIZATIONS:   - Tdap: Tetanus vaccination status reviewed: Refused. - Influenza: Up to date - Prevnar: Administered today - Zostavax vaccine: Given elsewhere  SCREENING: - Colonoscopy: cologuard ordered today  Discussed with patient purpose of the colonoscopy is to detect colon cancer at curable precancerous or early stages   PATIENT COUNSELING:    Sexuality: Discussed sexually transmitted diseases, partner selection, use of condoms, avoidance of unintended pregnancy  and contraceptive alternatives.   Advised to avoid cigarette smoking.  I discussed with the patient that most people either abstain from alcohol or drink within safe limits (<=14/week and <=4 drinks/occasion for males, <=7/weeks and <= 3 drinks/occasion for females) and that the risk for alcohol disorders and other health effects rises proportionally with the number of drinks per week and how often a drinker exceeds daily limits.  Discussed cessation/primary prevention of drug use and availability of treatment for abuse.   Diet: Encouraged to adjust caloric intake to maintain  or achieve ideal body weight, to reduce intake of dietary saturated fat and total fat, to limit sodium intake by avoiding high sodium foods and not adding table salt, and to maintain adequate dietary potassium and calcium preferably from fresh fruits, vegetables, and low-fat dairy products.  stressed the importance of regular exercise  Injury prevention: Discussed safety belts, safety helmets, smoke detector, smoking near bedding or upholstery.   Dental health: Discussed importance of regular tooth brushing, flossing, and dental visits.   Follow up plan: NEXT PREVENTATIVE PHYSICAL DUE IN 1 YEAR. Return in about 1 year (around 10/02/2023), or OK to cancel annual wellness tomorrow, 1 year with me for  physical.

## 2022-10-01 NOTE — Assessment & Plan Note (Signed)
Rechecking labs today. Await results. Treat as needed.  °

## 2022-10-01 NOTE — Patient Instructions (Signed)
Preventative Services:  Health Risk Assessment and Personalized Prevention Plan: done today Bone Mass Measurements: N/A CVD Screening: Done today Colon Cancer Screening: ordered today Depression Screening: done today Diabetes Screening: done today Glaucoma Screening: See your eye doctor Hepatitis B vaccine: N/A Hepatitis C screening: Done today HIV Screening: up to date Flu Vaccine: up to date Lung cancer Screening: N/A Obesity Screening: Done today Pneumonia Vaccines: done today STI Screening: N/A PSA screening: Done today

## 2022-10-02 LAB — MICROALBUMIN / CREATININE URINE RATIO
Creatinine, Urine: 14.7 mg/dL
Microalb/Creat Ratio: <20 mg/g creat (ref 0–29)
Microalbumin, Urine: <3 ug/mL

## 2022-10-02 LAB — CBC WITH DIFFERENTIAL/PLATELET
Basophils Absolute: 0 10*3/uL (ref 0.0–0.2)
Basos: 0 %
EOS (ABSOLUTE): 0.2 10*3/uL (ref 0.0–0.4)
Eos: 2 %
Hematocrit: 46.4 % (ref 37.5–51.0)
Hemoglobin: 16.8 g/dL (ref 13.0–17.7)
Immature Grans (Abs): 0 10*3/uL (ref 0.0–0.1)
Immature Granulocytes: 0 %
Lymphocytes Absolute: 2.3 10*3/uL (ref 0.7–3.1)
Lymphs: 24 %
MCH: 31.9 pg (ref 26.6–33.0)
MCHC: 36.2 g/dL — ABNORMAL HIGH (ref 31.5–35.7)
MCV: 88 fL (ref 79–97)
Monocytes Absolute: 0.6 10*3/uL (ref 0.1–0.9)
Monocytes: 6 %
Neutrophils Absolute: 6.6 10*3/uL (ref 1.4–7.0)
Neutrophils: 68 %
Platelets: 259 10*3/uL (ref 150–450)
RBC: 5.26 x10E6/uL (ref 4.14–5.80)
RDW: 11.9 % (ref 11.6–15.4)
WBC: 9.7 10*3/uL (ref 3.4–10.8)

## 2022-10-02 LAB — LIPID PANEL W/O CHOL/HDL RATIO
Cholesterol, Total: 142 mg/dL (ref 100–199)
HDL: 39 mg/dL — ABNORMAL LOW (ref >39)
LDL Chol Calc (NIH): 84 mg/dL (ref 0–99)
Triglycerides: 102 mg/dL (ref 0–149)
VLDL Cholesterol Cal: 19 mg/dL (ref 5–40)

## 2022-10-02 LAB — COMPREHENSIVE METABOLIC PANEL
ALT: 19 IU/L (ref 0–44)
AST: 18 IU/L (ref 0–40)
Albumin/Globulin Ratio: 2.1 (ref 1.2–2.2)
Albumin: 4.6 g/dL (ref 3.8–4.8)
Alkaline Phosphatase: 65 IU/L (ref 44–121)
BUN/Creatinine Ratio: 15 (ref 10–24)
BUN: 19 mg/dL (ref 8–27)
Bilirubin Total: 0.5 mg/dL (ref 0.0–1.2)
CO2: 24 mmol/L (ref 20–29)
Calcium: 9.6 mg/dL (ref 8.6–10.2)
Chloride: 102 mmol/L (ref 96–106)
Creatinine, Ser: 1.23 mg/dL (ref 0.76–1.27)
Globulin, Total: 2.2 g/dL (ref 1.5–4.5)
Glucose: 80 mg/dL (ref 70–99)
Potassium: 4.2 mmol/L (ref 3.5–5.2)
Sodium: 140 mmol/L (ref 134–144)
Total Protein: 6.8 g/dL (ref 6.0–8.5)
eGFR: 62 mL/min/{1.73_m2} (ref >59)

## 2022-10-02 LAB — URINALYSIS, ROUTINE W REFLEX MICROSCOPIC
Bilirubin, UA: NEGATIVE
Glucose, UA: NEGATIVE
Ketones, UA: NEGATIVE
Leukocytes,UA: NEGATIVE
Nitrite, UA: NEGATIVE
Protein,UA: NEGATIVE
Specific Gravity, UA: 1.005 (ref 1.005–1.030)
Urobilinogen, Ur: 0.2 mg/dL (ref 0.2–1.0)
pH, UA: 6.5 (ref 5.0–7.5)

## 2022-10-02 LAB — MICROSCOPIC EXAMINATION
Bacteria, UA: NONE SEEN
Casts: NONE SEEN /lpf
Epithelial Cells (non renal): NONE SEEN /hpf (ref 0–10)
RBC, Urine: NONE SEEN /hpf (ref 0–2)
WBC, UA: NONE SEEN /hpf (ref 0–5)

## 2022-10-02 LAB — PSA: Prostate Specific Ag, Serum: 4 ng/mL (ref 0.0–4.0)

## 2022-10-02 LAB — HEPATITIS C ANTIBODY: Hep C Virus Ab: NONREACTIVE

## 2022-10-02 LAB — TSH: TSH: 0.804 u[IU]/mL (ref 0.450–4.500)

## 2022-10-25 DIAGNOSIS — Z1211 Encounter for screening for malignant neoplasm of colon: Secondary | ICD-10-CM | POA: Diagnosis not present

## 2022-11-06 LAB — COLOGUARD: COLOGUARD: NEGATIVE

## 2022-11-19 DIAGNOSIS — L570 Actinic keratosis: Secondary | ICD-10-CM | POA: Diagnosis not present

## 2022-11-19 DIAGNOSIS — D225 Melanocytic nevi of trunk: Secondary | ICD-10-CM | POA: Diagnosis not present

## 2022-11-19 DIAGNOSIS — L853 Xerosis cutis: Secondary | ICD-10-CM | POA: Diagnosis not present

## 2022-11-19 DIAGNOSIS — L821 Other seborrheic keratosis: Secondary | ICD-10-CM | POA: Diagnosis not present

## 2022-11-19 DIAGNOSIS — L814 Other melanin hyperpigmentation: Secondary | ICD-10-CM | POA: Diagnosis not present

## 2023-02-21 ENCOUNTER — Ambulatory Visit: Payer: Self-pay | Admitting: *Deleted

## 2023-02-21 NOTE — Telephone Encounter (Signed)
  Chief Complaint: medication request: Cialis   Disposition: [] ED /[] Urgent Care (no appt availability in office) / [] Appointment(In office/virtual)/ []  [facility]/ [] Home Care/ [] Refused Recommended Disposition /[] [facility]/ [x]  Follow-up with PCP Additional Notes: Patient states he asked PCP to fill this Rx when in office- had to wait for lab results- they are normal. He states he usually gets 6 month supply- [facility])- has his records on this medication    Answer Assessment - Initial Assessment Questions 1. NAME of MEDICINE: "What medicine(s) are you calling about?"     Cialis  2. QUESTION: "What is your question?" (e.g., double dose of medicine, side effect)     Patient normally gets Cialis form urology- but only wants to see them every 2 years 3. PRESCRIBER: "Who prescribed the medicine?" Reason: if prescribed by specialist, call should be referred to that group.     Requesting Rx from PCP  Patient is requesting PCP fill Cialis- labs are normal- he states he usually gets 6 month supply- [facility])- has his records on this medication . Patient states he did briefly ask PCP to do this. Advised will send request.  Protocols used: Medication Question Call-A-AH

## 2023-02-21 NOTE — Telephone Encounter (Signed)
Attempted to return his call.    Left a message with a male that answered the phone to have him call [facility] back and gave her the number.

## 2023-02-21 NOTE — Telephone Encounter (Signed)
Message from [place] sent at 02/21/2023  1:24 PM EDT  Summary: Pt requesting Rx not listed on chart   Pt stated that his labs came back and he would like to request a Rx for Cialis. The medication is not listed on the pt chart but he insists that the Rx be sent to [facility] at [facility] [place], [place] - [address] Phone: [phone] Fax: [phone]          Call History   Type Contact Phone/Fax User  02/21/2023 01:21 PM EDT Phone ([address]) [place], [place] (Self) [phone] Rexene Edison) Marylen Ponto

## 2023-02-22 MED ORDER — TADALAFIL 5 MG PO TABS: 5.0000 mg | ORAL_TABLET | Freq: Every day | ORAL | 3 refills | Status: DC

## 2023-02-22 NOTE — Telephone Encounter (Signed)
Rx sent to his pharmacy

## 2023-02-22 NOTE — Telephone Encounter (Signed)
I do not have this on his history. I'm happy to write it for him, but I don't have what dose he was on. Can we please find this out?

## 2023-02-22 NOTE — Telephone Encounter (Signed)
Spoke with patient to make him aware of Dr Henriette Combs recommendations. Patient says he was going to clarify the strength of the requested medication and give our office a call back.   OK for [facility] to gather information when patient calls back.

## 2023-02-22 NOTE — Telephone Encounter (Signed)
Copied from CRM [id number]. Topic: General - Other >> Feb 22, 2023 11:40 AM [place] B wrote: Reason for CRM: Pt called back with dosage of cialis med, it is 5mg 

## 2023-04-09 ENCOUNTER — Ambulatory Visit (INDEPENDENT_AMBULATORY_CARE_PROVIDER_SITE_OTHER): Payer: Medicare Other | Admitting: Family Medicine

## 2023-04-09 ENCOUNTER — Encounter: Payer: Self-pay | Admitting: Family Medicine

## 2023-04-09 VITALS — BP 143/84 | HR 79 | Temp 98.8°F | Wt 199.4 lb

## 2023-04-09 DIAGNOSIS — R0989 Other specified symptoms and signs involving the circulatory and respiratory systems: Secondary | ICD-10-CM | POA: Diagnosis not present

## 2023-04-09 DIAGNOSIS — J029 Acute pharyngitis, unspecified: Secondary | ICD-10-CM

## 2023-04-09 DIAGNOSIS — R051 Acute cough: Secondary | ICD-10-CM

## 2023-04-09 MED ORDER — PREDNISONE 10 MG PO TABS: ORAL_TABLET | ORAL | 0 refills | Status: DC

## 2023-04-09 MED ORDER — BENZONATATE 100 MG PO CAPS: 100.0000 mg | ORAL_CAPSULE | Freq: Two times a day (BID) | ORAL | 0 refills | Status: DC | PRN

## 2023-04-09 NOTE — Progress Notes (Signed)
BP (!) 143/84   Pulse 79   Temp 98.8 F (37.1 C) (Oral)   Wt 199 lb 6.4 oz (90.4 kg)   SpO2 96%   BMI 27.92 kg/m    Subjective:    Patient ID: Dennis Byrd, male    DOB: 1951-09-01, 72 y.o.   MRN: [id number]  HPI: Dennis Byrd is a 72 y.o. male  Chief Complaint  Patient presents with   URI    Pt states he has been having a cough, chest congestion, sinus pressure, and fatigue for the last week. States he has been taking Mucinex and Advil with little to no relief.    UPPER RESPIRATORY TRACT INFECTION Felt hot and flushed this morning, but did not check his temperature, no fever in office today. Worst symptom:1 week Fever: no Cough: yes, productive intermittently Shortness of breath: no Wheezing: no Chest pain: no Chest tightness: yes Chest congestion: yes Nasal congestion: yes Runny nose: no Post nasal drip: yes Sneezing: yes Sore throat: yes Swollen glands: no Sinus pressure: yes  Headache: no Face pain: no Toothache: no Ear pain: no  Ear pressure: no  Eyes red/itching:no Eye drainage/crusting: no  Vomiting: no Rash: no Fatigue: yes Sick contacts: yes went on vacation last week Strep contacts: no  Context: worse Recurrent sinusitis: no Relief with OTC cold/cough medications: no  Treatments attempted: OTC antihistamine and Mucinex  Relevant past medical, surgical, family and social history reviewed and updated as indicated. Interim medical history since our last visit reviewed. Allergies and medications reviewed and updated.  Review of Systems  Constitutional:  Negative for chills, fatigue and fever.  HENT:  Positive for congestion, postnasal drip, rhinorrhea, sinus pressure, sneezing and sore throat. Negative for ear pain and sinus pain.   Eyes:  Negative for discharge, redness and itching.  Respiratory:  Positive for cough. Negative for chest tightness, shortness of breath and wheezing.   Cardiovascular: Negative.  Negative for chest pain.   Gastrointestinal:  Negative for vomiting.  Skin:  Negative for rash.  Neurological:  Negative for headaches.    Per HPI unless specifically indicated above     Objective:    BP (!) 143/84   Pulse 79   Temp 98.8 F (37.1 C) (Oral)   Wt 199 lb 6.4 oz (90.4 kg)   SpO2 96%   BMI 27.92 kg/m   Wt Readings from Last 3 Encounters:  04/09/23 199 lb 6.4 oz (90.4 kg)  10/01/22 209 lb 11.2 oz (95.1 kg)  02/08/22 211 lb 12.8 oz (96.1 kg)    Physical Exam Vitals and nursing note reviewed.  Constitutional:      General: He is awake. He is not in acute distress.    Appearance: Normal appearance. He is well-developed and well-groomed. He is not ill-appearing, toxic-appearing or diaphoretic.  HENT:     Head: Normocephalic and atraumatic.     Right Ear: Hearing, tympanic membrane, ear canal and external ear normal. No drainage. There is no impacted cerumen.     Left Ear: Hearing, tympanic membrane, ear canal and external ear normal. No drainage. There is no impacted cerumen.     Nose: Nose normal.     Mouth/Throat:     Mouth: Mucous membranes are moist.     Pharynx: Oropharynx is clear. No oropharyngeal exudate or posterior oropharyngeal erythema.  Eyes:     General: Lids are normal. No scleral icterus.       Right eye: No discharge.  Left eye: No discharge.     Extraocular Movements: Extraocular movements intact.     Conjunctiva/sclera: Conjunctivae normal.     Pupils: Pupils are equal, round, and reactive to light.  Neck:     Vascular: No carotid bruit.  Cardiovascular:     Rate and Rhythm: Normal rate and regular rhythm.     Pulses: Normal pulses.     Heart sounds: Normal heart sounds, S1 normal and S2 normal. No murmur heard.    No friction rub. No gallop.  Pulmonary:     Effort: Pulmonary effort is normal. No accessory muscle usage or respiratory distress.     Breath sounds: Normal breath sounds. No stridor. No wheezing, rhonchi or rales.  Chest:     Chest wall: No  tenderness.  Musculoskeletal:        General: Normal range of motion.     Cervical back: Full passive range of motion without pain, normal range of motion and neck supple. No rigidity. No muscular tenderness.     Right lower leg: No edema.     Left lower leg: No edema.  Lymphadenopathy:     Cervical: No cervical adenopathy.  Skin:    General: Skin is warm and dry.     Capillary Refill: Capillary refill takes less than 2 seconds.     Coloration: Skin is not jaundiced or pale.     Findings: No bruising, erythema, lesion or rash.  Neurological:     General: No focal deficit present.     Mental Status: He is alert and oriented to person, place, and time. Mental status is at baseline.     Cranial Nerves: No cranial nerve deficit.     Sensory: No sensory deficit.     Motor: No weakness.     Coordination: Coordination normal.     Gait: Gait normal.     Deep Tendon Reflexes: Reflexes normal.  Psychiatric:        Attention and Perception: Attention normal.        Mood and Affect: Mood normal.        Speech: Speech normal.        Behavior: Behavior normal. Behavior is cooperative.        Thought Content: Thought content normal.        Judgment: Judgment normal.     Results for orders placed or performed in visit on 10/01/22  Microscopic Examination  Result Value Ref Range   WBC, UA None seen 0 - 5 /hpf   RBC, Urine None seen 0 - 2 /hpf   Epithelial Cells (non renal) None seen 0 - 10 /hpf   Casts None seen None seen /lpf   Bacteria, UA None seen None seen/Few  Comprehensive metabolic panel  Result Value Ref Range   Glucose 80 70 - 99 mg/dL   BUN 19 8 - 27 mg/dL   Creatinine, Ser 1.61 0.76 - 1.27 mg/dL   eGFR 62 >09 UE/AVW/0.98   BUN/Creatinine Ratio 15 10 - 24   Sodium 140 134 - 144 mmol/L   Potassium 4.2 3.5 - 5.2 mmol/L   Chloride 102 96 - 106 mmol/L   CO2 24 20 - 29 mmol/L   Calcium 9.6 8.6 - 10.2 mg/dL   Total Protein 6.8 6.0 - 8.5 g/dL   Albumin 4.6 3.8 - 4.8 g/dL    Globulin, Total 2.2 1.5 - 4.5 g/dL   Albumin/Globulin Ratio 2.1 1.2 - 2.2   Bilirubin Total 0.5 0.0 - 1.2 mg/dL  Alkaline Phosphatase 65 44 - 121 IU/L   AST 18 0 - 40 IU/L   ALT 19 0 - 44 IU/L  CBC with Differential/Platelet  Result Value Ref Range   WBC 9.7 3.4 - 10.8 x10E3/uL   RBC 5.26 4.14 - 5.80 x10E6/uL   Hemoglobin 16.8 13.0 - 17.7 g/dL   Hematocrit 16.1 09.6 - 51.0 %   MCV 88 79 - 97 fL   MCH 31.9 26.6 - 33.0 pg   MCHC 36.2 (H) 31.5 - 35.7 g/dL   RDW 04.5 40.9 - 81.1 %   Platelets 259 150 - 450 x10E3/uL   Neutrophils 68 Not Estab. %   Lymphs 24 Not Estab. %   Monocytes 6 Not Estab. %   Eos 2 Not Estab. %   Basos 0 Not Estab. %   Neutrophils Absolute 6.6 1.4 - 7.0 x10E3/uL   Lymphocytes Absolute 2.3 0.7 - 3.1 x10E3/uL   Monocytes Absolute 0.6 0.1 - 0.9 x10E3/uL   EOS (ABSOLUTE) 0.2 0.0 - 0.4 x10E3/uL   Basophils Absolute 0.0 0.0 - 0.2 x10E3/uL   Immature Granulocytes 0 Not Estab. %   Immature Grans (Abs) 0.0 0.0 - 0.1 x10E3/uL  Lipid Panel w/o Chol/HDL Ratio  Result Value Ref Range   Cholesterol, Total 142 100 - 199 mg/dL   Triglycerides 914 0 - 149 mg/dL   HDL 39 (L) >78 mg/dL   VLDL Cholesterol Cal 19 5 - 40 mg/dL   LDL Chol Calc (NIH) 84 0 - 99 mg/dL  PSA  Result Value Ref Range   Prostate Specific Ag, Serum 4.0 0.0 - 4.0 ng/mL  TSH  Result Value Ref Range   TSH 0.804 0.450 - 4.500 uIU/mL  Urinalysis, Routine w reflex microscopic  Result Value Ref Range   Specific Gravity, UA 1.005 1.005 - 1.030   pH, UA 6.5 5.0 - 7.5   Color, UA Yellow Yellow   Appearance Ur Clear Clear   Leukocytes,UA Negative Negative   Protein,UA Negative Negative/Trace   Glucose, UA Negative Negative   Ketones, UA Negative Negative   RBC, UA Trace (A) Negative   Bilirubin, UA Negative Negative   Urobilinogen, Ur 0.2 0.2 - 1.0 mg/dL   Nitrite, UA Negative Negative   Microscopic Examination See below:   Urine Microalbumin w/creat. ratio  Result Value Ref Range   Creatinine,  Urine 14.7 Not Estab. mg/dL   Microalbumin, Urine <2.9 Not Estab. ug/mL   Microalb/Creat Ratio <20 0 - 29 mg/g creat  Hepatitis C Antibody  Result Value Ref Range   Hep C Virus Ab Non Reactive Non Reactive  Cologuard  Result Value Ref Range   COLOGUARD Negative Negative      Assessment & Plan:   Problem List Items Addressed This Visit     Chest congestion - Primary    Acute, ongoing. Patient not agreeable with care plan. Prednisone 6 day taper sent to pharmacy with instructions: if no better in 3 days will send antibiotic.       Other Visit Diagnoses     Sore throat       Acute, ongoing. Strep test negative. Continue Advil for relief. 6 day Prednisone taper.   Relevant Orders   Rapid Strep screen([facility])   Acute cough       Acute, ongoing. Tessalon BID PRN ordered.        Follow up plan: Return in about 6 months (around 10/02/2023), or if symptoms worsen or fail to improve, for physical.

## 2023-04-09 NOTE — Assessment & Plan Note (Signed)
Acute, ongoing. Patient not agreeable with care plan. Prednisone 6 day taper sent to pharmacy with instructions: if no better in 3 days will send antibiotic.

## 2023-04-12 ENCOUNTER — Ambulatory Visit: Payer: Self-pay | Admitting: *Deleted

## 2023-04-12 LAB — RAPID STREP SCREEN (MED CTR MEBANE ONLY): Strep Gp A Ag, IA W/Reflex: NEGATIVE

## 2023-04-12 LAB — CULTURE, GROUP A STREP: Strep A Culture: NEGATIVE

## 2023-04-12 MED ORDER — AZITHROMYCIN 250 MG PO TABS: ORAL_TABLET | ORAL | 0 refills | Status: AC

## 2023-04-12 NOTE — Telephone Encounter (Signed)
Summary: OV 04/09/23 Not feeling better Needing Advice   Pt is calling to report that he was seen on 04/09/23- message with medication if not better to call back. Sx congestion, Patient reports that he asked for an antibotic on 04/09/23. Patient reports that he does not want to speak to a nurse. Just wanting the antibiotic to be called in. Please advise         Chief Complaint: requesting antibiotics since last OV 04/09/23 Symptoms: continued congestion in chest  Frequency: since 04/09/23 Pertinent Negatives: Patient denies chest pain or difficulty breathing. Patient did not want to reports any other issues to NT Disposition: [] ED /[] Urgent Care (no appt availability in office) / [] Appointment(In office/virtual)/ []  [facility]/ [] Home Care/ [] Refused Recommended Disposition /[] [facility]/ [x]  Follow-up with PCP Additional Notes:   Requesting antibiotics . Was told to call back and did not want to answer NT questions regarding sx. Feels  wase of time.       Reason for Disposition  Cough has been present for > 3 weeks    Cough present since OV 04/09/23  Answer Assessment - Initial Assessment Questions 1. ONSET: "When did the cough begin?"      na 2. SEVERITY: "How bad is the cough today?"      N6/18/24o better since  3. SPUTUM: "Describe the color of your sputum" (none, dry cough; clear, white, yellow, green)     Na  4. HEMOPTYSIS: "Are you coughing up any blood?" If so ask: "How much?" (flecks, streaks, tablespoons, etc.)     Na  5. DIFFICULTY BREATHING: "Are you having difficulty breathing?" If Yes, ask: "How bad is it?" (e.g., mild, moderate, severe)    - MILD: No SOB at rest, mild SOB with walking, speaks normally in sentences, can lie down, no retractions, pulse < 100.    - MODERATE: SOB at rest, SOB with minimal exertion and prefers to sit, cannot lie down flat, speaks in phrases, mild retractions, audible wheezing, pulse 100-120.    - SEVERE: Very SOB at  rest, speaks in single words, struggling to breathe, sitting hunched forward, retractions, pulse > 120      Denies  6. FEVER: "Do you have a fever?" If Yes, ask: "What is your temperature, how was it measured, and when did it start?"     Na  7. CARDIAC HISTORY: "Do you have any history of heart disease?" (e.g., heart attack, congestive heart failure)      Na  8. LUNG HISTORY: "Do you have any history of lung disease?"  (e.g., pulmonary embolus, asthma, emphysema)     Na  9. PE RISK FACTORS: "Do you have a history of blood clots?" (or: recent major surgery, recent prolonged travel, bedridden)     Na  10. OTHER SYMPTOMS: "Do you have any other symptoms?" (e.g., runny nose, wheezing, chest pain)       Chest congestion not better. Requesting antibiotics 11. PREGNANCY: "Is there any chance you are pregnant?" "When was your last menstrual period?"       Na  12. TRAVEL: "Have you traveled out of the country in the last month?" (e.g., travel history, exposures)       Na  Protocols used: Cough - Acute Productive-A-AH

## 2023-05-20 DIAGNOSIS — L578 Other skin changes due to chronic exposure to nonionizing radiation: Secondary | ICD-10-CM | POA: Diagnosis not present

## 2023-05-20 DIAGNOSIS — L570 Actinic keratosis: Secondary | ICD-10-CM | POA: Diagnosis not present

## 2023-06-20 ENCOUNTER — Telehealth: Payer: Self-pay | Admitting: Family Medicine

## 2023-06-20 NOTE — Telephone Encounter (Signed)
Copied from CRM [id number]. Topic: General - Inquiry >> Jun 19, 2023  3:46 PM Dennis Byrd wrote: Reason for CRM: pt wants to know if he can get an order for a PSA.  He declined to make an appt, would like to know if there is anyone there (since Dr Laural Benes is out) that can write him an order for a PSA.  Please call back to advise.

## 2023-06-21 ENCOUNTER — Other Ambulatory Visit: Payer: Self-pay | Admitting: Pediatrics

## 2023-06-21 DIAGNOSIS — Z125 Encounter for screening for malignant neoplasm of prostate: Secondary | ICD-10-CM

## 2023-06-21 NOTE — Telephone Encounter (Signed)
Called and informed pt that order was placed he [place] that [place] the beach and will be there for awhile will call us back once he returns

## 2023-06-21 NOTE — Progress Notes (Signed)
Patient requested 

## 2023-06-21 NOTE — Telephone Encounter (Signed)
Duplicate

## 2023-07-11 ENCOUNTER — Telehealth: Payer: Self-pay | Admitting: Family Medicine

## 2023-07-11 NOTE — Telephone Encounter (Signed)
Order is already in, just needs to be released

## 2023-07-11 NOTE — Telephone Encounter (Signed)
Copied from CRM [id number]. Topic: General - Other >> Jul 10, 2023  3:36 PM Phill Myron wrote:  Prostate cancer screening. Mr. Eckerd is coming in for labs, the order had an expected date, does he need a new order? Please advise

## 2023-07-11 NOTE — Telephone Encounter (Signed)
Called patient and informed him that all he needs to do is to come in and checkin at the front desk.

## 2023-07-12 ENCOUNTER — Other Ambulatory Visit: Payer: Medicare Other

## 2023-07-12 DIAGNOSIS — Z125 Encounter for screening for malignant neoplasm of prostate: Secondary | ICD-10-CM

## 2023-07-13 LAB — PSA: Prostate Specific Ag, Serum: 3.9 ng/mL (ref 0.0–4.0)

## 2023-07-26 DIAGNOSIS — Z20822 Contact with and (suspected) exposure to covid-19: Secondary | ICD-10-CM | POA: Diagnosis not present

## 2023-07-26 DIAGNOSIS — R059 Cough, unspecified: Secondary | ICD-10-CM | POA: Diagnosis not present

## 2023-10-03 ENCOUNTER — Ambulatory Visit: Payer: Medicare Other | Admitting: Emergency Medicine

## 2023-10-03 ENCOUNTER — Encounter: Payer: Medicare Other | Admitting: Family Medicine

## 2023-10-03 ENCOUNTER — Encounter: Payer: Self-pay | Admitting: Family Medicine

## 2023-10-03 VITALS — Ht 71.0 in | Wt 200.0 lb

## 2023-10-03 DIAGNOSIS — Z0000 Encounter for general adult medical examination without abnormal findings: Secondary | ICD-10-CM

## 2023-10-03 NOTE — Progress Notes (Signed)
Subjective:   Taeveon Varland is a 72 y.o. male who presents for Medicare Annual/Subsequent preventive examination.  Visit Complete: Virtual I connected with  Shamir Roughton on 10/03/23 by a audio enabled telemedicine application and verified that I am speaking with the correct person using two identifiers.  Patient Location: Home  Provider Location: Home Office  I discussed the limitations of evaluation and management by telemedicine. The patient expressed understanding and agreed to proceed.  Vital Signs: Because this visit was a virtual/telehealth visit, some criteria may be missing or patient reported. Any vitals not documented were not able to be obtained and vitals that have been documented are patient reported.   Cardiac Risk Factors include: advanced age (>79men, >29 women);male gender;dyslipidemia     Objective:    Today's Vitals   10/03/23 0815  Weight: 200 lb (90.7 kg)  Height: 5\' 11"  (1.803 m)   Body mass index is 27.89 kg/m.     10/03/2023    8:26 AM  Advanced Directives  Does Patient Have a Medical Advance Directive? No  Would patient like information on creating a medical advance directive? No - Patient declined    Current Medications (verified) Outpatient Encounter Medications as of 10/03/2023  Medication Sig   Omega-3 Fatty Acids (FISH OIL) 1000 MG CAPS Take 1 capsule by mouth daily.   tadalafil (CIALIS) 5 MG tablet Take 1 tablet (5 mg total) by mouth daily.   benzonatate (TESSALON) 100 MG capsule Take 1 capsule (100 mg total) by mouth 2 (two) times daily as needed for cough. (Patient not taking: Reported on 10/03/2023)   predniSONE (DELTASONE) 10 MG tablet Take 6 tablets on day 1, 5 tablets on day 2, 4 tablets on day 3, 3 tablets on day 4, 2 tablets on day 5, and 1 tablet on day 6. If symptoms not better after day 3, call office and we will send over antibiotic (Patient not taking: Reported on 10/03/2023)   No facility-administered encounter medications on  file as of 10/03/2023.    Allergies (verified) Patient has no known allergies.   History: History reviewed. No pertinent past medical history. Past Surgical History:  Procedure Laterality Date   CYST EXCISION     HERNIA REPAIR     Family History  Problem Relation Age of Onset   Other Mother 28       "old age"   Congestive Heart Failure Father    Social History   Socioeconomic History   Marital status: Legally Separated    Spouse name: Not on file   Number of children: 3   Years of education: Not on file   Highest education level: Not on file  Occupational History   Not on file  Tobacco Use   Smoking status: Former    Current packs/day: 0.00    Average packs/day: 0.5 packs/day for 7.0 years (3.5 ttl pk-yrs)    Types: Cigarettes    Start date: 31    Quit date: 73    Years since quitting: 40.9   Smokeless tobacco: Never  Vaping Use   Vaping status: Never Used  Substance and Sexual Activity   Alcohol use: No    Alcohol/week: 0.0 standard drinks of alcohol   Drug use: No   Sexual activity: Yes  Other Topics Concern   Not on file  Social History Narrative   Still working, owns Building services engineer   Social Drivers of Health   Financial Resource Strain: Low Risk  (10/03/2023)   Overall Physicist, medical Strain (  CARDIA)    Difficulty of Paying Living Expenses: Not hard at all  Food Insecurity: No Food Insecurity (10/03/2023)   Hunger Vital Sign    Worried About Running Out of Food in the Last Year: Never true    Ran Out of Food in the Last Year: Never true  Transportation Needs: No Transportation Needs (10/03/2023)   PRAPARE - Administrator, Civil Service (Medical): No    Lack of Transportation (Non-Medical): No  Physical Activity: Sufficiently Active (10/03/2023)   Exercise Vital Sign    Days of Exercise per Week: 7 days    Minutes of Exercise per Session: 30 min  Stress: No Stress Concern Present (10/03/2023)   [employer] of Occupational Health  - Occupational Stress Questionnaire    Feeling of Stress : Only a little  Social Connections: Moderately Isolated (10/03/2023)   Social Connection and Isolation Panel [NHANES]    Frequency of Communication with Friends and Family: More than three times a week    Frequency of Social Gatherings with Friends and Family: More than three times a week    Attends Religious Services: More than 4 times per year    Active Member of [employer] or Organizations: No    Attends Banker Meetings: Never    Marital Status: Separated    Tobacco Counseling Counseling given: Not Answered   Clinical Intake:  Pre-visit preparation completed: Yes  Pain : No/denies pain     BMI - recorded: 27.89 Nutritional Status: BMI 25 -29 Overweight Nutritional Risks: None Diabetes: No  How often do you need to have someone help you when you read instructions, pamphlets, or other written materials from your doctor or pharmacy?: 1 - Never  Interpreter Needed?: No  Information entered by :: Tora Kindred, CMA   Activities of Daily Living    10/03/2023    8:17 AM  In your present state of health, do you have any difficulty performing the following activities:  Hearing? 0  Vision? 0  Difficulty concentrating or making decisions? 0  Walking or climbing stairs? 0  Dressing or bathing? 0  Doing errands, shopping? 0  Preparing Food and eating ? N  Using the Toilet? N  In the past six months, have you accidently leaked urine? N  Do you have problems with loss of bowel control? N  Managing your Medications? N  Managing your Finances? N  Housekeeping or managing your Housekeeping? N    Patient Care Team: Dorcas Carrow, DO as PCP - General (Family Medicine) Wynona Canes, MD as Referring Physician (Specialist) Crista Elliot, MD as Consulting Physician (Urology)  Indicate any recent Medical Services you may have received from other than [facility] providers in the past year (date may be  approximate).     Assessment:   This is a routine wellness examination for Antavius.  Hearing/Vision screen Hearing Screening - Comments:: Denies hearing loss Vision Screening - Comments:: Gets eye exams   Goals Addressed               This Visit's Progress     Patient Stated (pt-stated)        Stay active, exercise more      Depression Screen    10/03/2023    8:23 AM 10/01/2022    3:22 PM 09/21/2021   11:13 AM 09/07/2021    1:06 PM 05/18/2021    2:42 PM 05/04/2016    8:32 AM  PHQ 2/9 Scores  PHQ - 2  Score 0 0 0 0 0 0  PHQ- 9 Score  0 0 0 0     Fall Risk    10/03/2023    8:27 AM 10/01/2022    3:22 PM 09/07/2021    1:06 PM 06/01/2021    1:41 PM 05/18/2021    2:41 PM  Fall Risk   Falls in the past year? 0 0 0 0 0  Number falls in past yr: 0 0 0 0 0  Injury with Fall? 0 0 0 0 0  Risk for fall due to : No Fall Risks No Fall Risks No Fall Risks No Fall Risks No Fall Risks  Follow up Falls prevention discussed Falls evaluation completed Falls evaluation completed Falls evaluation completed Falls evaluation completed    MEDICARE RISK AT HOME: Medicare Risk at Home Any stairs in or around the home?: No If so, are there any without handrails?: No Home free of loose throw rugs in walkways, pet beds, electrical cords, etc?: Yes Adequate lighting in your home to reduce risk of falls?: Yes Life alert?: No Use of a cane, walker or w/c?: No Grab bars in the bathroom?: No Shower chair or bench in shower?: Yes Elevated toilet seat or a handicapped toilet?: No  TIMED UP AND GO:  Was the test performed?  No    Cognitive Function:        10/03/2023    8:28 AM 10/01/2022    3:56 PM  6CIT Screen  What Year? 0 points 0 points  What month? 0 points 0 points  What time? 0 points 0 points  Count back from 20 2 points 0 points  Months in reverse 0 points 0 points  Repeat phrase 0 points 4 points  Total Score 2 points 4 points    Immunizations Immunization History   Administered Date(s) Administered   Fluad Quad(high Dose 65+) 09/07/2021   Influenza Split 10/07/2013   Influenza, High Dose Seasonal PF 08/07/2017   Influenza-Unspecified 08/22/2022   Moderna Sars-Covid-2 Vaccination 12/08/2019, 01/05/2020   PNEUMOCOCCAL CONJUGATE-20 10/01/2022    TDAP status: Due, Education has been provided regarding the importance of this vaccine. Advised may receive this vaccine at local pharmacy or Health Dept. Aware to provide a copy of the vaccination record if obtained from local pharmacy or Health Dept. Verbalized acceptance and understanding.  Flu Vaccine status: Up to date  Pneumococcal vaccine status: Up to date  Covid-19 vaccine status: Declined, Education has been provided regarding the importance of this vaccine but patient still declined. Advised may receive this vaccine at local pharmacy or Health Dept.or vaccine clinic. Aware to provide a copy of the vaccination record if obtained from local pharmacy or Health Dept. Verbalized acceptance and understanding.  Qualifies for Shingles Vaccine? Yes   Zostavax completed No   Shingrix Completed?: No.    Education has been provided regarding the importance of this vaccine. Patient has been advised to call insurance company to determine out of pocket expense if they have not yet received this vaccine. Advised may also receive vaccine at local pharmacy or Health Dept. Verbalized acceptance and understanding.  Screening Tests Health Maintenance  Topic Date Due   DTaP/Tdap/Td (1 - Tdap) Never done   Zoster Vaccines- Shingrix (1 of 2) Never done   INFLUENZA VACCINE  05/23/2023   COVID-19 Vaccine (3 - 2024-25 season) 06/23/2023   Medicare Annual Wellness (AWV)  10/02/2024   Fecal DNA (Cologuard)  10/25/2025   Pneumonia Vaccine 47+ Years old  Completed  Hepatitis C Screening  Completed   HPV VACCINES  Aged Out    Health Maintenance  Health Maintenance Due  Topic Date Due   DTaP/Tdap/Td (1 - Tdap) Never  done   Zoster Vaccines- Shingrix (1 of 2) Never done   INFLUENZA VACCINE  05/23/2023   COVID-19 Vaccine (3 - 2024-25 season) 06/23/2023    Colorectal cancer screening: Type of screening: Cologuard. Completed 10/25/22. Repeat every 3 years  Lung Cancer Screening: (Low Dose CT Chest recommended if Age 47-80 years, 20 pack-year currently smoking OR have quit w/in 15years.) does not qualify.   Lung Cancer Screening Referral: n/a  Additional Screening:  Hepatitis C Screening: does not qualify; Completed 10/01/22  Vision Screening: Recommended annual ophthalmology exams for early detection of glaucoma and other disorders of the eye.  Dental Screening: Recommended annual dental exams for proper oral hygiene   Community Resource Referral / Chronic Care Management: CRR required this visit?  No   CCM required this visit?  No     Plan:     I have personally reviewed and noted the following in the patient's chart:   Medical and social history Use of alcohol, tobacco or illicit drugs  Current medications and supplements including opioid prescriptions. Patient is not currently taking opioid prescriptions. Functional ability and status Nutritional status Physical activity Advanced directives List of other physicians Hospitalizations, surgeries, and ER visits in previous 12 months Vitals Screenings to include cognitive, depression, and falls Referrals and appointments  In addition, I have reviewed and discussed with patient certain preventive protocols, quality metrics, and best practice recommendations. A written personalized care plan for preventive services as well as general preventive health recommendations were provided to patient.     Tora Kindred, CMA   10/03/2023   After Visit Summary: (MyChart) Due to this being a telephonic visit, the after visit summary with patients personalized plan was offered to patient via MyChart   Nurse Notes:  Needs Tdap and shingles  vaccines Declined Covid vaccine

## 2023-10-03 NOTE — Patient Instructions (Addendum)
[name] , Thank you for taking time to come for your Medicare Wellness Visit. I appreciate your ongoing commitment to your health goals. Please review the following plan we discussed and let me know if I can assist you in the future.   Referrals/Orders/Follow-Ups/Clinician Recommendations: Recommend getting a Tetanus shot and the Shingrix (shingles) vaccines.  This is a list of the screening recommended for you and due dates:  Health Maintenance  Topic Date Due   DTaP/Tdap/Td vaccine (1 - Tdap) Never done   Zoster (Shingles) Vaccine (1 of 2) Never done   COVID-19 Vaccine (3 - 2024-25 season) 06/23/2023   Medicare Annual Wellness Visit  10/02/2024   Cologuard (Stool DNA test)  10/25/2025   Pneumonia Vaccine  Completed   Flu Shot  Completed   Hepatitis C Screening  Completed   HPV Vaccine  Aged Out    Advanced directives: (Declined) Advance directive discussed with you today. Even though you declined this today, please call our office should you change your mind, and we can give you the proper paperwork for you to fill out.  Next Medicare Annual Wellness Visit scheduled for next year: Yes, 10/08/24 @ 8:00am

## 2023-10-07 ENCOUNTER — Ambulatory Visit (INDEPENDENT_AMBULATORY_CARE_PROVIDER_SITE_OTHER): Payer: Medicare Other | Admitting: Family Medicine

## 2023-10-07 ENCOUNTER — Encounter: Payer: Self-pay | Admitting: Family Medicine

## 2023-10-07 VITALS — BP 142/68 | HR 67 | Ht 69.0 in | Wt 211.2 lb

## 2023-10-07 DIAGNOSIS — R350 Frequency of micturition: Secondary | ICD-10-CM

## 2023-10-07 DIAGNOSIS — E782 Mixed hyperlipidemia: Secondary | ICD-10-CM | POA: Diagnosis not present

## 2023-10-07 DIAGNOSIS — Z0000 Encounter for general adult medical examination without abnormal findings: Secondary | ICD-10-CM

## 2023-10-07 DIAGNOSIS — R030 Elevated blood-pressure reading, without diagnosis of hypertension: Secondary | ICD-10-CM | POA: Diagnosis not present

## 2023-10-07 LAB — MICROALBUMIN, URINE WAIVED
Creatinine, Urine Waived: 50 mg/dL (ref 10–300)
Microalb, Ur Waived: 30 mg/L — ABNORMAL HIGH (ref 0–19)

## 2023-10-07 NOTE — Progress Notes (Signed)
BP (!) 142/68   Pulse 67   Ht 5\' 9"  (1.753 m)   Wt 211 lb 3.2 oz (95.8 kg)   SpO2 98%   BMI 31.19 kg/m    Subjective:    Patient ID: Dennis Byrd, male    DOB: 1951/06/08, 72 y.o.   MRN: [id number]  HPI: Dennis Byrd is a 72 y.o. male presenting on 10/07/2023 for comprehensive medical examination. Current medical complaints include:none  Interim Problems from his last visit: no  Depression Screen done today and results listed below:     10/03/2023    8:23 AM 10/01/2022    3:22 PM 09/21/2021   11:13 AM 09/07/2021    1:06 PM 05/18/2021    2:42 PM  Depression screen PHQ 2/9  Decreased Interest 0 0 0 0 0  Down, Depressed, Hopeless 0 0 0 0 0  PHQ - 2 Score 0 0 0 0 0  Altered sleeping  0 0 0 0  Tired, decreased energy  0 0 0 0  Change in appetite  0 0 0 0  Feeling bad or failure about yourself   0 0 0 0  Trouble concentrating  0 0 0 0  Moving slowly or fidgety/restless  0 0 0 0  Suicidal thoughts  0 0 0 0  PHQ-9 Score  0 0 0 0  Difficult doing work/chores  Not difficult at all Not difficult at all Not difficult at all     Past Medical History:  History reviewed. No pertinent past medical history.  Surgical History:  Past Surgical History:  Procedure Laterality Date   CYST EXCISION     HERNIA REPAIR      Medications:  Current Outpatient Medications on File Prior to Visit  Medication Sig   Omega-3 Fatty Acids (FISH OIL) 1000 MG CAPS Take 1 capsule by mouth daily.   tadalafil (CIALIS) 5 MG tablet Take 1 tablet (5 mg total) by mouth daily.   No current facility-administered medications on file prior to visit.    Allergies:  No Known Allergies  Social History:  Social History   Socioeconomic History   Marital status: Legally Separated    Spouse name: Not on file   Number of children: 3   Years of education: Not on file   Highest education level: Not on file  Occupational History   Not on file  Tobacco Use   Smoking status: Former    Current packs/day:  0.00    Average packs/day: 0.5 packs/day for 7.0 years (3.5 ttl pk-yrs)    Types: Cigarettes    Start date: 44    Quit date: 54    Years since quitting: 40.9   Smokeless tobacco: Never  Vaping Use   Vaping status: Never Used  Substance and Sexual Activity   Alcohol use: No    Alcohol/week: 0.0 standard drinks of alcohol   Drug use: No   Sexual activity: Yes  Other Topics Concern   Not on file  Social History Narrative   Still working, owns Building services engineer   Social Drivers of Corporate investment banker Strain: Low Risk  (10/03/2023)   Overall Financial Resource Strain (CARDIA)    Difficulty of Paying Living Expenses: Not hard at all  Food Insecurity: No Food Insecurity (10/03/2023)   Hunger Vital Sign    Worried About Running Out of Food in the Last Year: Never true    Ran Out of Food in the Last Year: Never true  Transportation Needs: No Transportation  Needs (10/03/2023)   PRAPARE - Administrator, Civil Service (Medical): No    Lack of Transportation (Non-Medical): No  Physical Activity: Sufficiently Active (10/03/2023)   Exercise Vital Sign    Days of Exercise per Week: 7 days    Minutes of Exercise per Session: 30 min  Stress: No Stress Concern Present (10/03/2023)   [employer] of Occupational Health - Occupational Stress Questionnaire    Feeling of Stress : Only a little  Social Connections: Moderately Isolated (10/03/2023)   Social Connection and Isolation Panel [NHANES]    Frequency of Communication with Friends and Family: More than three times a week    Frequency of Social Gatherings with Friends and Family: More than three times a week    Attends Religious Services: More than 4 times per year    Active Member of [employer] or Organizations: No    Attends Banker Meetings: Never    Marital Status: Separated  Intimate Partner Violence: Not At Risk (10/03/2023)   Humiliation, Afraid, Rape, and Kick questionnaire    Fear of Current or  Ex-Partner: No    Emotionally Abused: No    Physically Abused: No    Sexually Abused: No   Social History   Tobacco Use  Smoking Status Former   Current packs/day: 0.00   Average packs/day: 0.5 packs/day for 7.0 years (3.5 ttl pk-yrs)   Types: Cigarettes   Start date: 55   Quit date: 1984   Years since quitting: 40.9  Smokeless Tobacco Never   Social History   Substance and Sexual Activity  Alcohol Use No   Alcohol/week: 0.0 standard drinks of alcohol    Family History:  Family History  Problem Relation Age of Onset   Other Mother 58       "old age"   Congestive Heart Failure Father     Past medical history, surgical history, medications, allergies, family history and social history reviewed with patient today and changes made to appropriate areas of the chart.   Review of Systems  Constitutional: Negative.   HENT: Negative.    Eyes: Negative.   Respiratory: Negative.    Cardiovascular: Negative.   Gastrointestinal: Negative.   Genitourinary: Negative.   Musculoskeletal: Negative.   Skin: Negative.   Neurological: Negative.   Endo/Heme/Allergies: Negative.   Psychiatric/Behavioral: Negative.     All other ROS negative except what is listed above and in the HPI.      Objective:    BP (!) 142/68   Pulse 67   Ht 5\' 9"  (1.753 m)   Wt 211 lb 3.2 oz (95.8 kg)   SpO2 98%   BMI 31.19 kg/m   Wt Readings from Last 3 Encounters:  10/07/23 211 lb 3.2 oz (95.8 kg)  10/03/23 200 lb (90.7 kg)  04/09/23 199 lb 6.4 oz (90.4 kg)    Physical Exam Vitals and nursing note reviewed.  Constitutional:      General: He is not in acute distress.    Appearance: Normal appearance. He is obese. He is not ill-appearing, toxic-appearing or diaphoretic.  HENT:     Head: Normocephalic and atraumatic.     Right Ear: Tympanic membrane, ear canal and external ear normal. There is no impacted cerumen.     Left Ear: Tympanic membrane, ear canal and external ear normal. There is  no impacted cerumen.     Nose: Nose normal. No congestion or rhinorrhea.     Mouth/Throat:     Mouth: Mucous  membranes are moist.     Pharynx: Oropharynx is clear. No oropharyngeal exudate or posterior oropharyngeal erythema.  Eyes:     General: No scleral icterus.       Right eye: No discharge.        Left eye: No discharge.     Extraocular Movements: Extraocular movements intact.     Conjunctiva/sclera: Conjunctivae normal.     Pupils: Pupils are equal, round, and reactive to light.  Neck:     Vascular: No carotid bruit.  Cardiovascular:     Rate and Rhythm: Normal rate and regular rhythm.     Pulses: Normal pulses.     Heart sounds: No murmur heard.    No friction rub. No gallop.  Pulmonary:     Effort: Pulmonary effort is normal. No respiratory distress.     Breath sounds: Normal breath sounds. No stridor. No wheezing, rhonchi or rales.  Chest:     Chest wall: No tenderness.  Abdominal:     General: Abdomen is flat. Bowel sounds are normal. There is no distension.     Palpations: Abdomen is soft. There is no mass.     Tenderness: There is no abdominal tenderness. There is no right CVA tenderness, left CVA tenderness, guarding or rebound.     Hernia: No hernia is present.  Genitourinary:    Comments: Genital exam deferred with shared decision making Musculoskeletal:        General: No swelling, tenderness, deformity or signs of injury.     Cervical back: Normal range of motion and neck supple. No rigidity. No muscular tenderness.     Right lower leg: No edema.     Left lower leg: No edema.  Lymphadenopathy:     Cervical: No cervical adenopathy.  Skin:    General: Skin is warm and dry.     Capillary Refill: Capillary refill takes less than 2 seconds.     Coloration: Skin is not jaundiced or pale.     Findings: No bruising, erythema, lesion or rash.  Neurological:     General: No focal deficit present.     Mental Status: He is alert and oriented to person, place, and  time.     Cranial Nerves: No cranial nerve deficit.     Sensory: No sensory deficit.     Motor: No weakness.     Coordination: Coordination normal.     Gait: Gait normal.     Deep Tendon Reflexes: Reflexes normal.  Psychiatric:        Mood and Affect: Mood normal.        Behavior: Behavior normal.        Thought Content: Thought content normal.        Judgment: Judgment normal.     Results for orders placed or performed in visit on 07/12/23  PSA   Collection Time: 07/12/23 11:09 AM  Result Value Ref Range   Prostate Specific Ag, Serum 3.9 0.0 - 4.0 ng/mL      Assessment & Plan:   Problem List Items Addressed This Visit       Other   Hyperlipidemia   Rechecking labs today. Await results. Treat as needed.       Relevant Orders   Comprehensive metabolic panel   CBC with Differential/Platelet   Lipid Panel w/o Chol/HDL Ratio   Other Visit Diagnoses       Routine general medical examination at a health care facility    -  Primary   Vaccines  up to date/declined. Screening labs checked today. Cologuard up to date. Continue diet and exercise. Call with any concerns.     Urinary frequency       Will check PSA. Await results. Treat as needed.   Relevant Orders   PSA   TSH     Elevated blood pressure reading without diagnosis of hypertension       Will work on diet and exercise and monitor at home. Call if staying consitently >140/90   Relevant Orders   TSH   Microalbumin, Urine Waived        LABORATORY TESTING:  Health maintenance labs ordered today as discussed above.   The natural history of prostate cancer and ongoing controversy regarding screening and potential treatment outcomes of prostate cancer has been discussed with the patient. The meaning of a false positive PSA and a false negative PSA has been discussed. He indicates understanding of the limitations of this screening test and wishes to proceed with screening PSA testing.   IMMUNIZATIONS:   - Tdap:  Tetanus vaccination status reviewed: Declined. - Influenza: Up to date - Pneumovax: Up to date - Prevnar: Up to date - COVID: Refused - HPV: Not applicable - Shingrix vaccine: Refused  SCREENING: - Colonoscopy: Up to date  Discussed with patient purpose of the colonoscopy is to detect colon cancer at curable precancerous or early stages   PATIENT COUNSELING:    Sexuality: Discussed sexually transmitted diseases, partner selection, use of condoms, avoidance of unintended pregnancy  and contraceptive alternatives.   Advised to avoid cigarette smoking.  I discussed with the patient that most people either abstain from alcohol or drink within safe limits (<=14/week and <=4 drinks/occasion for males, <=7/weeks and <= 3 drinks/occasion for females) and that the risk for alcohol disorders and other health effects rises proportionally with the number of drinks per week and how often a drinker exceeds daily limits.  Discussed cessation/primary prevention of drug use and availability of treatment for abuse.   Diet: Encouraged to adjust caloric intake to maintain  or achieve ideal body weight, to reduce intake of dietary saturated fat and total fat, to limit sodium intake by avoiding high sodium foods and not adding table salt, and to maintain adequate dietary potassium and calcium preferably from fresh fruits, vegetables, and low-fat dairy products.    stressed the importance of regular exercise  Injury prevention: Discussed safety belts, safety helmets, smoke detector, smoking near bedding or upholstery.   Dental health: Discussed importance of regular tooth brushing, flossing, and dental visits.   Follow up plan: NEXT PREVENTATIVE PHYSICAL DUE IN 1 YEAR. Return in about 6 months (around 04/06/2024).

## 2023-10-07 NOTE — Assessment & Plan Note (Signed)
Rechecking labs today. Await results. Treat as needed.  °

## 2023-10-08 LAB — COMPREHENSIVE METABOLIC PANEL
ALT: 22 [IU]/L (ref 0–44)
AST: 19 [IU]/L (ref 0–40)
Albumin: 4.2 g/dL (ref 3.8–4.8)
Alkaline Phosphatase: 63 [IU]/L (ref 44–121)
BUN/Creatinine Ratio: 14 (ref 10–24)
BUN: 20 mg/dL (ref 8–27)
Bilirubin Total: 0.4 mg/dL (ref 0.0–1.2)
CO2: 23 mmol/L (ref 20–29)
Calcium: 9.5 mg/dL (ref 8.6–10.2)
Chloride: 103 mmol/L (ref 96–106)
Creatinine, Ser: 1.4 mg/dL — ABNORMAL HIGH (ref 0.76–1.27)
Globulin, Total: 2.2 g/dL (ref 1.5–4.5)
Glucose: 118 mg/dL — ABNORMAL HIGH (ref 70–99)
Potassium: 4.8 mmol/L (ref 3.5–5.2)
Sodium: 141 mmol/L (ref 134–144)
Total Protein: 6.4 g/dL (ref 6.0–8.5)
eGFR: 53 mL/min/{1.73_m2} — ABNORMAL LOW (ref >59)

## 2023-10-08 LAB — TSH: TSH: 0.844 u[IU]/mL (ref 0.450–4.500)

## 2023-10-08 LAB — LIPID PANEL W/O CHOL/HDL RATIO
Cholesterol, Total: 146 mg/dL (ref 100–199)
HDL: 37 mg/dL — ABNORMAL LOW (ref >39)
LDL Chol Calc (NIH): 89 mg/dL (ref 0–99)
Triglycerides: 107 mg/dL (ref 0–149)
VLDL Cholesterol Cal: 20 mg/dL (ref 5–40)

## 2023-10-08 LAB — CBC WITH DIFFERENTIAL/PLATELET
Basophils Absolute: 0 10*3/uL (ref 0.0–0.2)
Basos: 0 %
EOS (ABSOLUTE): 0.3 10*3/uL (ref 0.0–0.4)
Eos: 4 %
Hematocrit: 49.5 % (ref 37.5–51.0)
Hemoglobin: 16.7 g/dL (ref 13.0–17.7)
Immature Grans (Abs): 0 10*3/uL (ref 0.0–0.1)
Immature Granulocytes: 1 %
Lymphocytes Absolute: 2 10*3/uL (ref 0.7–3.1)
Lymphs: 24 %
MCH: 32.4 pg (ref 26.6–33.0)
MCHC: 33.7 g/dL (ref 31.5–35.7)
MCV: 96 fL (ref 79–97)
Monocytes Absolute: 0.5 10*3/uL (ref 0.1–0.9)
Monocytes: 6 %
Neutrophils Absolute: 5.4 10*3/uL (ref 1.4–7.0)
Neutrophils: 65 %
Platelets: 240 10*3/uL (ref 150–450)
RBC: 5.15 x10E6/uL (ref 4.14–5.80)
RDW: 12.8 % (ref 11.6–15.4)
WBC: 8.4 10*3/uL (ref 3.4–10.8)

## 2023-10-08 LAB — PSA: Prostate Specific Ag, Serum: 3.9 ng/mL (ref 0.0–4.0)

## 2023-11-13 DIAGNOSIS — H2513 Age-related nuclear cataract, bilateral: Secondary | ICD-10-CM | POA: Diagnosis not present

## 2023-11-20 DIAGNOSIS — D225 Melanocytic nevi of trunk: Secondary | ICD-10-CM | POA: Diagnosis not present

## 2023-11-20 DIAGNOSIS — L570 Actinic keratosis: Secondary | ICD-10-CM | POA: Diagnosis not present

## 2023-11-20 DIAGNOSIS — D0461 Carcinoma in situ of skin of right upper limb, including shoulder: Secondary | ICD-10-CM | POA: Diagnosis not present

## 2023-11-20 DIAGNOSIS — D492 Neoplasm of unspecified behavior of bone, soft tissue, and skin: Secondary | ICD-10-CM | POA: Diagnosis not present

## 2023-11-20 DIAGNOSIS — L821 Other seborrheic keratosis: Secondary | ICD-10-CM | POA: Diagnosis not present

## 2023-11-20 DIAGNOSIS — L538 Other specified erythematous conditions: Secondary | ICD-10-CM | POA: Diagnosis not present

## 2023-11-20 DIAGNOSIS — L814 Other melanin hyperpigmentation: Secondary | ICD-10-CM | POA: Diagnosis not present

## 2023-12-20 DIAGNOSIS — D0461 Carcinoma in situ of skin of right upper limb, including shoulder: Secondary | ICD-10-CM | POA: Diagnosis not present

## 2024-03-30 ENCOUNTER — Encounter: Payer: Self-pay | Admitting: Family Medicine

## 2024-03-30 ENCOUNTER — Other Ambulatory Visit: Payer: Self-pay | Admitting: Family Medicine

## 2024-03-30 DIAGNOSIS — R350 Frequency of micturition: Secondary | ICD-10-CM

## 2024-04-06 ENCOUNTER — Ambulatory Visit (INDEPENDENT_AMBULATORY_CARE_PROVIDER_SITE_OTHER): Payer: Self-pay | Admitting: Family Medicine

## 2024-04-06 ENCOUNTER — Encounter: Payer: Self-pay | Admitting: Family Medicine

## 2024-04-06 VITALS — BP 152/78 | HR 65 | Wt 204.4 lb

## 2024-04-06 DIAGNOSIS — R3911 Hesitancy of micturition: Secondary | ICD-10-CM | POA: Diagnosis not present

## 2024-04-06 DIAGNOSIS — E782 Mixed hyperlipidemia: Secondary | ICD-10-CM | POA: Diagnosis not present

## 2024-04-06 DIAGNOSIS — N401 Enlarged prostate with lower urinary tract symptoms: Secondary | ICD-10-CM | POA: Diagnosis not present

## 2024-04-06 DIAGNOSIS — I129 Hypertensive chronic kidney disease with stage 1 through stage 4 chronic kidney disease, or unspecified chronic kidney disease: Secondary | ICD-10-CM | POA: Diagnosis not present

## 2024-04-06 DIAGNOSIS — N400 Enlarged prostate without lower urinary tract symptoms: Secondary | ICD-10-CM | POA: Insufficient documentation

## 2024-04-06 LAB — MICROALBUMIN, URINE WAIVED
Creatinine, Urine Waived: 10 mg/dL (ref 10–300)
Microalb, Ur Waived: 10 mg/L (ref 0–19)

## 2024-04-06 MED ORDER — LISINOPRIL 5 MG PO TABS: 2.5000 mg | ORAL_TABLET | Freq: Every day | ORAL | 3 refills | Status: DC

## 2024-04-06 MED ORDER — TADALAFIL 5 MG PO TABS: 5.0000 mg | ORAL_TABLET | Freq: Every day | ORAL | 3 refills | Status: AC

## 2024-04-06 NOTE — Assessment & Plan Note (Signed)
 Under good control on current regimen. Continue current regimen. Continue to monitor. Call with any concerns. Refills given. Labs drawn today.

## 2024-04-06 NOTE — Assessment & Plan Note (Signed)
 Rechecking labs today. Await results. Treat as needed.

## 2024-04-06 NOTE — Assessment & Plan Note (Signed)
 Will start low dose lisinopril for renal protection. Call with any concerns. Follow up 1-3 months.

## 2024-04-06 NOTE — Progress Notes (Signed)
 BP (!) 152/78   Pulse 65   Wt 204 lb 6.4 oz (92.7 kg)   SpO2 97%   BMI 30.18 kg/m    Subjective:    Patient ID: Dennis Byrd, male    DOB: 12-30-1950, 73 y.o.   MRN: [id number]  HPI: Dennis Byrd is a 73 y.o. male  Chief Complaint  Patient presents with   Hyperlipidemia   HYPERTENSION / HYPERLIPIDEMIA Satisfied with current treatment? yes Duration of hypertension: at least months BP monitoring frequency: a few times a day BP range: 115-145 systolic- usually sitting about 135 BP medication side effects: N/A Past BP meds: none Duration of hyperlipidemia: chronic Cholesterol medication side effects: no Cholesterol supplements: fish oil Past cholesterol medications: none Medication compliance: N/A Aspirin: no Recent stressors: no Recurrent headaches: no Visual changes: no Palpitations: no Dyspnea: no Chest pain: no Lower extremity edema: no Dizzy/lightheaded: no  BPH BPH status: controlled Satisfied with current treatment?: yes Medication side effects: no Medication compliance: excellent compliance Duration: chronic Nocturia: 1/night Urinary frequency:yes Incomplete voiding: no Urgency: no Weak urinary stream: yes Straining to start stream: no Dysuria: no Onset: gradual Severity: mild   Relevant past medical, surgical, family and social history reviewed and updated as indicated. Interim medical history since our last visit reviewed. Allergies and medications reviewed and updated.  Review of Systems  Constitutional: Negative.   Respiratory: Negative.    Cardiovascular: Negative.   Musculoskeletal: Negative.   Neurological: Negative.   Psychiatric/Behavioral: Negative.      Per HPI unless specifically indicated above     Objective:    BP (!) 152/78   Pulse 65   Wt 204 lb 6.4 oz (92.7 kg)   SpO2 97%   BMI 30.18 kg/m   Wt Readings from Last 3 Encounters:  04/06/24 204 lb 6.4 oz (92.7 kg)  10/07/23 211 lb 3.2 oz (95.8 kg)  10/03/23 200 lb  (90.7 kg)    Physical Exam Vitals and nursing note reviewed.  Constitutional:      General: He is not in acute distress.    Appearance: Normal appearance. He is not ill-appearing, toxic-appearing or diaphoretic.  HENT:     Head: Normocephalic and atraumatic.     Right Ear: External ear normal.     Left Ear: External ear normal.     Nose: Nose normal.     Mouth/Throat:     Mouth: Mucous membranes are moist.     Pharynx: Oropharynx is clear.   Eyes:     General: No scleral icterus.       Right eye: No discharge.        Left eye: No discharge.     Extraocular Movements: Extraocular movements intact.     Conjunctiva/sclera: Conjunctivae normal.     Pupils: Pupils are equal, round, and reactive to light.    Cardiovascular:     Rate and Rhythm: Normal rate and regular rhythm.     Pulses: Normal pulses.     Heart sounds: Normal heart sounds. No murmur heard.    No friction rub. No gallop.  Pulmonary:     Effort: Pulmonary effort is normal. No respiratory distress.     Breath sounds: Normal breath sounds. No stridor. No wheezing, rhonchi or rales.  Chest:     Chest wall: No tenderness.   Musculoskeletal:        General: Normal range of motion.     Cervical back: Normal range of motion and neck supple.   Skin:  General: Skin is warm and dry.     Capillary Refill: Capillary refill takes less than 2 seconds.     Coloration: Skin is not jaundiced or pale.     Findings: No bruising, erythema, lesion or rash.   Neurological:     General: No focal deficit present.     Mental Status: He is alert and oriented to person, place, and time. Mental status is at baseline.   Psychiatric:        Mood and Affect: Mood normal.        Behavior: Behavior normal.        Thought Content: Thought content normal.        Judgment: Judgment normal.     Results for orders placed or performed in visit on 10/07/23  Microalbumin, Urine Waived   Collection Time: 10/07/23  8:27 AM  Result Value  Ref Range   Microalb, Ur Waived 30 (H) 0 - 19 mg/L   Creatinine, Urine Waived 50 10 - 300 mg/dL   Microalb/Creat Ratio 30-300 (H) <30 mg/g  Comprehensive metabolic panel   Collection Time: 10/07/23  8:28 AM  Result Value Ref Range   Glucose 118 (H) 70 - 99 mg/dL   BUN 20 8 - 27 mg/dL   Creatinine, Ser 1.61 (H) 0.76 - 1.27 mg/dL   eGFR 53 (L) >09 UE/AVW/0.98   BUN/Creatinine Ratio 14 10 - 24   Sodium 141 134 - 144 mmol/L   Potassium 4.8 3.5 - 5.2 mmol/L   Chloride 103 96 - 106 mmol/L   CO2 23 20 - 29 mmol/L   Calcium 9.5 8.6 - 10.2 mg/dL   Total Protein 6.4 6.0 - 8.5 g/dL   Albumin 4.2 3.8 - 4.8 g/dL   Globulin, Total 2.2 1.5 - 4.5 g/dL   Bilirubin Total 0.4 0.0 - 1.2 mg/dL   Alkaline Phosphatase 63 44 - 121 IU/L   AST 19 0 - 40 IU/L   ALT 22 0 - 44 IU/L  CBC with Differential/Platelet   Collection Time: 10/07/23  8:28 AM  Result Value Ref Range   WBC 8.4 3.4 - 10.8 x10E3/uL   RBC 5.15 4.14 - 5.80 x10E6/uL   Hemoglobin 16.7 13.0 - 17.7 g/dL   Hematocrit 11.9 14.7 - 51.0 %   MCV 96 79 - 97 fL   MCH 32.4 26.6 - 33.0 pg   MCHC 33.7 31.5 - 35.7 g/dL   RDW 82.9 56.2 - 13.0 %   Platelets 240 150 - 450 x10E3/uL   Neutrophils 65 Not Estab. %   Lymphs 24 Not Estab. %   Monocytes 6 Not Estab. %   Eos 4 Not Estab. %   Basos 0 Not Estab. %   Neutrophils Absolute 5.4 1.4 - 7.0 x10E3/uL   Lymphocytes Absolute 2.0 0.7 - 3.1 x10E3/uL   Monocytes Absolute 0.5 0.1 - 0.9 x10E3/uL   EOS (ABSOLUTE) 0.3 0.0 - 0.4 x10E3/uL   Basophils Absolute 0.0 0.0 - 0.2 x10E3/uL   Immature Granulocytes 1 Not Estab. %   Immature Grans (Abs) 0.0 0.0 - 0.1 x10E3/uL  Lipid Panel w/o Chol/HDL Ratio   Collection Time: 10/07/23  8:28 AM  Result Value Ref Range   Cholesterol, Total 146 100 - 199 mg/dL   Triglycerides 865 0 - 149 mg/dL   HDL 37 (L) >78 mg/dL   VLDL Cholesterol Cal 20 5 - 40 mg/dL   LDL Chol Calc (NIH) 89 0 - 99 mg/dL  PSA   Collection Time: 10/07/23  8:28 AM  Result Value Ref Range    Prostate Specific Ag, Serum 3.9 0.0 - 4.0 ng/mL  TSH   Collection Time: 10/07/23  8:28 AM  Result Value Ref Range   TSH 0.844 0.450 - 4.500 uIU/mL      Assessment & Plan:   Problem List Items Addressed This Visit       Genitourinary   BPH (benign prostatic hyperplasia)   Under good control on current regimen. Continue current regimen. Continue to monitor. Call with any concerns. Refills given. Labs drawn today.       Relevant Orders   PSA   Benign hypertensive renal disease   Will start low dose lisinopril for renal protection. Call with any concerns. Follow up 1-3 months.       Relevant Orders   Microalbumin, Urine Waived     Other   Hyperlipidemia - Primary   Rechecking labs today. Await results. Treat as needed.       Relevant Medications   lisinopril (ZESTRIL) 5 MG tablet   tadalafil  (CIALIS ) 5 MG tablet   Other Relevant Orders   Comprehensive metabolic panel with GFR   Lipid Panel w/o Chol/HDL Ratio     Follow up plan: Return 1-3 months, follow up blood pressure.

## 2024-04-07 ENCOUNTER — Ambulatory Visit: Payer: Self-pay | Admitting: Family Medicine

## 2024-04-07 LAB — COMPREHENSIVE METABOLIC PANEL WITH GFR
ALT: 18 IU/L (ref 0–44)
AST: 17 IU/L (ref 0–40)
Albumin: 4.4 g/dL (ref 3.8–4.8)
Alkaline Phosphatase: 66 IU/L (ref 44–121)
BUN/Creatinine Ratio: 13 (ref 10–24)
BUN: 16 mg/dL (ref 8–27)
Bilirubin Total: 0.5 mg/dL (ref 0.0–1.2)
CO2: 20 mmol/L (ref 20–29)
Calcium: 9.2 mg/dL (ref 8.6–10.2)
Chloride: 102 mmol/L (ref 96–106)
Creatinine, Ser: 1.27 mg/dL (ref 0.76–1.27)
Globulin, Total: 1.7 g/dL (ref 1.5–4.5)
Glucose: 111 mg/dL — ABNORMAL HIGH (ref 70–99)
Potassium: 4.7 mmol/L (ref 3.5–5.2)
Sodium: 139 mmol/L (ref 134–144)
Total Protein: 6.1 g/dL (ref 6.0–8.5)
eGFR: 60 mL/min/{1.73_m2} (ref >59)

## 2024-04-07 LAB — PSA: Prostate Specific Ag, Serum: 4.4 ng/mL — ABNORMAL HIGH (ref 0.0–4.0)

## 2024-04-07 LAB — LIPID PANEL W/O CHOL/HDL RATIO
Cholesterol, Total: 139 mg/dL (ref 100–199)
HDL: 37 mg/dL — ABNORMAL LOW (ref >39)
LDL Chol Calc (NIH): 86 mg/dL (ref 0–99)
Triglycerides: 84 mg/dL (ref 0–149)
VLDL Cholesterol Cal: 16 mg/dL (ref 5–40)

## 2024-05-20 ENCOUNTER — Ambulatory Visit (INDEPENDENT_AMBULATORY_CARE_PROVIDER_SITE_OTHER): Admitting: Family Medicine

## 2024-05-20 ENCOUNTER — Encounter: Payer: Self-pay | Admitting: Family Medicine

## 2024-05-20 VITALS — BP 157/85 | HR 63 | Temp 97.7°F | Ht 69.0 in | Wt 207.0 lb

## 2024-05-20 DIAGNOSIS — D492 Neoplasm of unspecified behavior of bone, soft tissue, and skin: Secondary | ICD-10-CM | POA: Diagnosis not present

## 2024-05-20 DIAGNOSIS — L570 Actinic keratosis: Secondary | ICD-10-CM | POA: Diagnosis not present

## 2024-05-20 DIAGNOSIS — L538 Other specified erythematous conditions: Secondary | ICD-10-CM | POA: Diagnosis not present

## 2024-05-20 DIAGNOSIS — L820 Inflamed seborrheic keratosis: Secondary | ICD-10-CM | POA: Diagnosis not present

## 2024-05-20 DIAGNOSIS — H6121 Impacted cerumen, right ear: Secondary | ICD-10-CM | POA: Diagnosis not present

## 2024-05-20 DIAGNOSIS — I129 Hypertensive chronic kidney disease with stage 1 through stage 4 chronic kidney disease, or unspecified chronic kidney disease: Secondary | ICD-10-CM | POA: Diagnosis not present

## 2024-05-20 DIAGNOSIS — L821 Other seborrheic keratosis: Secondary | ICD-10-CM | POA: Diagnosis not present

## 2024-05-20 DIAGNOSIS — L814 Other melanin hyperpigmentation: Secondary | ICD-10-CM | POA: Diagnosis not present

## 2024-05-20 DIAGNOSIS — R233 Spontaneous ecchymoses: Secondary | ICD-10-CM | POA: Diagnosis not present

## 2024-05-20 DIAGNOSIS — D225 Melanocytic nevi of trunk: Secondary | ICD-10-CM | POA: Diagnosis not present

## 2024-05-20 MED ORDER — LOSARTAN POTASSIUM 25 MG PO TABS: 12.5000 mg | ORAL_TABLET | Freq: Every day | ORAL | 2 refills | Status: DC

## 2024-05-20 NOTE — Progress Notes (Signed)
 BP (!) 157/85   Pulse 63   Temp 97.7 F (36.5 C) (Oral)   Ht 5' 9 (1.753 m)   Wt 207 lb (93.9 kg)   SpO2 97%   BMI 30.57 kg/m    Subjective:    Patient ID: Dennis Byrd, male    DOB: 1951/02/24, 73 y.o.   MRN: [id number]  HPI: Dennis Byrd is a 73 y.o. male  Chief Complaint  Patient presents with   Cerumen Impaction    Onset about a week ago. Denies. Does fell pressure    HYPERTENSION  Hypertension status: uncontrolled  Satisfied with current treatment? no Duration of hypertension: chronic BP monitoring frequency:  not checking BP medication side effects:  yes- was having chest pressure on lisinopril , stopped when he stopped the medicine Medication compliance: poor compliance Previous BP meds:lisinopril  Aspirin: no Recurrent headaches: no Visual changes: no Palpitations: no Dyspnea: no Chest pain: no Lower extremity edema: no Dizzy/lightheaded: no  EAG CLOGGED Duration: weeks Involved ear(s):  right Sensation of feeling clogged/plugged: yes Decreased/muffled hearing:yes Ear pain: no Fever: no Otorrhea: no Hearing loss: no Upper respiratory infection symptoms: no Using Q-Tips: no Status: stable History of cerumenosis: yes Treatments attempted: debrox   Relevant past medical, surgical, family and social history reviewed and updated as indicated. Interim medical history since our last visit reviewed. Allergies and medications reviewed and updated.  Review of Systems  Constitutional: Negative.   HENT:  Positive for ear discharge and ear pain. Negative for congestion, dental problem, drooling, facial swelling, hearing loss, mouth sores, nosebleeds, postnasal drip, rhinorrhea, sinus pressure, sinus pain, sneezing, sore throat, tinnitus, trouble swallowing and voice change.   Respiratory: Negative.    Cardiovascular: Negative.   Musculoskeletal: Negative.   Psychiatric/Behavioral: Negative.      Per HPI unless specifically indicated above      Objective:    BP (!) 157/85   Pulse 63   Temp 97.7 F (36.5 C) (Oral)   Ht 5' 9 (1.753 m)   Wt 207 lb (93.9 kg)   SpO2 97%   BMI 30.57 kg/m   Wt Readings from Last 3 Encounters:  05/20/24 207 lb (93.9 kg)  04/06/24 204 lb 6.4 oz (92.7 kg)  10/07/23 211 lb 3.2 oz (95.8 kg)    Physical Exam Vitals and nursing note reviewed.  Constitutional:      General: He is not in acute distress.    Appearance: Normal appearance. He is not ill-appearing, toxic-appearing or diaphoretic.  HENT:     Head: Normocephalic and atraumatic.     Right Ear: External ear normal. There is impacted cerumen.     Left Ear: Tympanic membrane, ear canal and external ear normal.     Nose: Nose normal.     Mouth/Throat:     Mouth: Mucous membranes are moist.     Pharynx: Oropharynx is clear.  Eyes:     General: No scleral icterus.       Right eye: No discharge.        Left eye: No discharge.     Extraocular Movements: Extraocular movements intact.     Conjunctiva/sclera: Conjunctivae normal.     Pupils: Pupils are equal, round, and reactive to light.  Cardiovascular:     Rate and Rhythm: Normal rate and regular rhythm.     Pulses: Normal pulses.     Heart sounds: Normal heart sounds. No murmur heard.    No friction rub. No gallop.  Pulmonary:     Effort: Pulmonary  effort is normal. No respiratory distress.     Breath sounds: Normal breath sounds. No stridor. No wheezing, rhonchi or rales.  Chest:     Chest wall: No tenderness.  Musculoskeletal:        General: Normal range of motion.     Cervical back: Normal range of motion and neck supple.  Skin:    General: Skin is warm and dry.     Capillary Refill: Capillary refill takes less than 2 seconds.     Coloration: Skin is not jaundiced or pale.     Findings: No bruising, erythema, lesion or rash.  Neurological:     General: No focal deficit present.     Mental Status: He is alert and oriented to person, place, and time. Mental status is at  baseline.  Psychiatric:        Mood and Affect: Mood normal.        Behavior: Behavior normal.        Thought Content: Thought content normal.        Judgment: Judgment normal.     Results for orders placed or performed in visit on 04/06/24  Comprehensive metabolic panel with GFR   Collection Time: 04/06/24  8:42 AM  Result Value Ref Range   Glucose 111 (H) 70 - 99 mg/dL   BUN 16 8 - 27 mg/dL   Creatinine, Ser 8.72 0.76 - 1.27 mg/dL   eGFR 60 >40 fO/fpw/8.26   BUN/Creatinine Ratio 13 10 - 24   Sodium 139 134 - 144 mmol/L   Potassium 4.7 3.5 - 5.2 mmol/L   Chloride 102 96 - 106 mmol/L   CO2 20 20 - 29 mmol/L   Calcium 9.2 8.6 - 10.2 mg/dL   Total Protein 6.1 6.0 - 8.5 g/dL   Albumin 4.4 3.8 - 4.8 g/dL   Globulin, Total 1.7 1.5 - 4.5 g/dL   Bilirubin Total 0.5 0.0 - 1.2 mg/dL   Alkaline Phosphatase 66 44 - 121 IU/L   AST 17 0 - 40 IU/L   ALT 18 0 - 44 IU/L  Lipid Panel w/o Chol/HDL Ratio   Collection Time: 04/06/24  8:42 AM  Result Value Ref Range   Cholesterol, Total 139 100 - 199 mg/dL   Triglycerides 84 0 - 149 mg/dL   HDL 37 (L) >60 mg/dL   VLDL Cholesterol Cal 16 5 - 40 mg/dL   LDL Chol Calc (NIH) 86 0 - 99 mg/dL  PSA   Collection Time: 04/06/24  8:42 AM  Result Value Ref Range   Prostate Specific Ag, Serum 4.4 (H) 0.0 - 4.0 ng/mL  Microalbumin, Urine Waived   Collection Time: 04/06/24  8:42 AM  Result Value Ref Range   Microalb, Ur Waived 10 0 - 19 mg/L   Creatinine, Urine Waived 10 10 - 300 mg/dL   Microalb/Creat Ratio 30-300 (H) <30 mg/g      Assessment & Plan:   Problem List Items Addressed This Visit       Genitourinary   Benign hypertensive renal disease - Primary   Running high. Chest pressure on the lisinopril . Will change to losartan  and recheck in 6-8 weeks.       Other Visit Diagnoses       Hearing loss due to cerumen impaction, right       Unable to be flushed. Will use debrox and come back in for nurse visit next week. Call with any  concerns.        Follow up  plan: Return for As scheduled.

## 2024-05-20 NOTE — Assessment & Plan Note (Signed)
 Running high. Chest pressure on the lisinopril . Will change to losartan  and recheck in 6-8 weeks.

## 2024-07-14 ENCOUNTER — Ambulatory Visit (INDEPENDENT_AMBULATORY_CARE_PROVIDER_SITE_OTHER): Admitting: Family Medicine

## 2024-07-14 ENCOUNTER — Encounter: Payer: Self-pay | Admitting: Family Medicine

## 2024-07-14 VITALS — BP 130/76 | HR 69 | Temp 97.9°F | Ht 69.0 in | Wt 204.2 lb

## 2024-07-14 DIAGNOSIS — R300 Dysuria: Secondary | ICD-10-CM

## 2024-07-14 DIAGNOSIS — I129 Hypertensive chronic kidney disease with stage 1 through stage 4 chronic kidney disease, or unspecified chronic kidney disease: Secondary | ICD-10-CM | POA: Diagnosis not present

## 2024-07-14 DIAGNOSIS — N401 Enlarged prostate with lower urinary tract symptoms: Secondary | ICD-10-CM | POA: Diagnosis not present

## 2024-07-14 DIAGNOSIS — R3911 Hesitancy of micturition: Secondary | ICD-10-CM | POA: Diagnosis not present

## 2024-07-14 LAB — URINALYSIS, ROUTINE W REFLEX MICROSCOPIC
Bilirubin, UA: NEGATIVE
Glucose, UA: NEGATIVE
Ketones, UA: NEGATIVE
Nitrite, UA: NEGATIVE
Protein,UA: NEGATIVE
Specific Gravity, UA: <1.005 — ABNORMAL LOW (ref 1.005–1.030)
Urobilinogen, Ur: 0.2 mg/dL (ref 0.2–1.0)
pH, UA: 5.5 (ref 5.0–7.5)

## 2024-07-14 LAB — MICROSCOPIC EXAMINATION: Bacteria, UA: NONE SEEN

## 2024-07-14 MED ORDER — LOSARTAN POTASSIUM 25 MG PO TABS: 12.5000 mg | ORAL_TABLET | Freq: Every day | ORAL | 1 refills | Status: AC

## 2024-07-14 NOTE — Progress Notes (Signed)
 BP 130/76 (BP Location: Left Arm, Patient Position: Sitting, Cuff Size: Large)   Pulse 69   Temp 97.9 F (36.6 C)   Ht 5' 9 (1.753 m)   Wt 204 lb 3.2 oz (92.6 kg)   SpO2 97%   BMI 30.16 kg/m    Subjective:    Patient ID: Dennis Byrd, male    DOB: March 24, 1951, 73 y.o.   MRN: [id number]  HPI: Dennis Byrd is a 73 y.o. male  Chief Complaint  Patient presents with   Hypertension    Pt presents today for a BP Check. Pt doesn't have any questions or concerns     HYPERTENSION  Hypertension status: controlled  Satisfied with current treatment? yes Duration of hypertension: chronic BP monitoring frequency:  not checking BP medication side effects:  no Medication compliance: excellent compliance Previous BP meds:losartan  Aspirin: no Recurrent headaches: no Visual changes: no Palpitations: no Dyspnea: no Chest pain: no Lower extremity edema: no Dizzy/lightheaded: no  Relevant past medical, surgical, family and social history reviewed and updated as indicated. Interim medical history since our last visit reviewed. Allergies and medications reviewed and updated.  Review of Systems  Constitutional: Negative.   Respiratory: Negative.    Cardiovascular: Negative.   Genitourinary:  Positive for dysuria (was treated about 5 weeks ago at [facility]).  Musculoskeletal: Negative.   Neurological: Negative.   Psychiatric/Behavioral: Negative.      Per HPI unless specifically indicated above     Objective:    BP 130/76 (BP Location: Left Arm, Patient Position: Sitting, Cuff Size: Large)   Pulse 69   Temp 97.9 F (36.6 C)   Ht 5' 9 (1.753 m)   Wt 204 lb 3.2 oz (92.6 kg)   SpO2 97%   BMI 30.16 kg/m   Wt Readings from Last 3 Encounters:  07/14/24 204 lb 3.2 oz (92.6 kg)  05/20/24 207 lb (93.9 kg)  04/06/24 204 lb 6.4 oz (92.7 kg)    Physical Exam Vitals and nursing note reviewed.  Constitutional:      General: He is not in acute distress.    Appearance: Normal appearance.  He is not ill-appearing, toxic-appearing or diaphoretic.  HENT:     Head: Normocephalic and atraumatic.     Right Ear: External ear normal.     Left Ear: External ear normal.     Nose: Nose normal.     Mouth/Throat:     Mouth: Mucous membranes are moist.     Pharynx: Oropharynx is clear.  Eyes:     General: No scleral icterus.       Right eye: No discharge.        Left eye: No discharge.     Extraocular Movements: Extraocular movements intact.     Conjunctiva/sclera: Conjunctivae normal.     Pupils: Pupils are equal, round, and reactive to light.  Cardiovascular:     Rate and Rhythm: Normal rate and regular rhythm.     Pulses: Normal pulses.     Heart sounds: Normal heart sounds. No murmur heard.    No friction rub. No gallop.  Pulmonary:     Effort: Pulmonary effort is normal. No respiratory distress.     Breath sounds: Normal breath sounds. No stridor. No wheezing, rhonchi or rales.  Chest:     Chest wall: No tenderness.  Musculoskeletal:        General: Normal range of motion.     Cervical back: Normal range of motion and neck supple.  Skin:  General: Skin is warm and dry.     Capillary Refill: Capillary refill takes less than 2 seconds.     Coloration: Skin is not jaundiced or pale.     Findings: No bruising, erythema, lesion or rash.  Neurological:     General: No focal deficit present.     Mental Status: He is alert and oriented to person, place, and time. Mental status is at baseline.  Psychiatric:        Mood and Affect: Mood normal.        Behavior: Behavior normal.        Thought Content: Thought content normal.        Judgment: Judgment normal.     Results for orders placed or performed in visit on 04/06/24  Comprehensive metabolic panel with GFR   Collection Time: 04/06/24  8:42 AM  Result Value Ref Range   Glucose 111 (H) 70 - 99 mg/dL   BUN 16 8 - 27 mg/dL   Creatinine, Ser 8.72 0.76 - 1.27 mg/dL   eGFR 60 >40 fO/fpw/8.26   BUN/Creatinine Ratio 13  10 - 24   Sodium 139 134 - 144 mmol/L   Potassium 4.7 3.5 - 5.2 mmol/L   Chloride 102 96 - 106 mmol/L   CO2 20 20 - 29 mmol/L   Calcium 9.2 8.6 - 10.2 mg/dL   Total Protein 6.1 6.0 - 8.5 g/dL   Albumin 4.4 3.8 - 4.8 g/dL   Globulin, Total 1.7 1.5 - 4.5 g/dL   Bilirubin Total 0.5 0.0 - 1.2 mg/dL   Alkaline Phosphatase 66 44 - 121 IU/L   AST 17 0 - 40 IU/L   ALT 18 0 - 44 IU/L  Lipid Panel w/o Chol/HDL Ratio   Collection Time: 04/06/24  8:42 AM  Result Value Ref Range   Cholesterol, Total 139 100 - 199 mg/dL   Triglycerides 84 0 - 149 mg/dL   HDL 37 (L) >60 mg/dL   VLDL Cholesterol Cal 16 5 - 40 mg/dL   LDL Chol Calc (NIH) 86 0 - 99 mg/dL  PSA   Collection Time: 04/06/24  8:42 AM  Result Value Ref Range   Prostate Specific Ag, Serum 4.4 (H) 0.0 - 4.0 ng/mL  Microalbumin, Urine Waived   Collection Time: 04/06/24  8:42 AM  Result Value Ref Range   Microalb, Ur Waived 10 0 - 19 mg/L   Creatinine, Urine Waived 10 10 - 300 mg/dL   Microalb/Creat Ratio 30-300 (H) <30 mg/g      Assessment & Plan:   Problem List Items Addressed This Visit       Genitourinary   Benign hypertensive renal disease - Primary   Under good control on current regimen. Continue current regimen. Continue to monitor. Call with any concerns. Refills given. Labs drawn today.        Relevant Orders   Basic metabolic panel with GFR   Other Visit Diagnoses       Dysuria       Will recheck urine and culture. Await results. Treat as needed.   Relevant Orders   Urinalysis, Routine w reflex microscopic   Urine Culture        Follow up plan: Return in about 6 months (around 01/11/2025).

## 2024-07-14 NOTE — Assessment & Plan Note (Signed)
 Under good control on current regimen. Continue current regimen. Continue to monitor. Call with any concerns. Refills given. Labs drawn today.

## 2024-07-15 ENCOUNTER — Encounter: Payer: Self-pay | Admitting: Family Medicine

## 2024-07-15 LAB — BASIC METABOLIC PANEL WITH GFR
BUN/Creatinine Ratio: 14 (ref 10–24)
BUN: 21 mg/dL (ref 8–27)
CO2: 22 mmol/L (ref 20–29)
Calcium: 9.8 mg/dL (ref 8.6–10.2)
Chloride: 101 mmol/L (ref 96–106)
Creatinine, Ser: 1.53 mg/dL — ABNORMAL HIGH (ref 0.76–1.27)
Glucose: 96 mg/dL (ref 70–99)
Potassium: 4.5 mmol/L (ref 3.5–5.2)
Sodium: 140 mmol/L (ref 134–144)
eGFR: 47 mL/min/1.73 — ABNORMAL LOW (ref >59)

## 2024-07-16 ENCOUNTER — Ambulatory Visit: Payer: Self-pay | Admitting: Family Medicine

## 2024-07-16 LAB — URINE CULTURE: Organism ID, Bacteria: NO GROWTH

## 2024-07-16 NOTE — Addendum Note (Signed)
 Addended by: VICCI DUWAINE SQUIBB on: 07/16/2024 01:48 PM   Modules accepted: Orders

## 2024-07-17 ENCOUNTER — Other Ambulatory Visit

## 2024-07-17 DIAGNOSIS — Z125 Encounter for screening for malignant neoplasm of prostate: Secondary | ICD-10-CM

## 2024-07-18 LAB — PSA: Prostate Specific Ag, Serum: 5.1 ng/mL — ABNORMAL HIGH (ref 0.0–4.0)

## 2024-07-23 ENCOUNTER — Ambulatory Visit: Payer: Self-pay | Admitting: Family Medicine

## 2024-07-23 ENCOUNTER — Encounter: Payer: Self-pay | Admitting: Family Medicine

## 2024-07-23 DIAGNOSIS — R972 Elevated prostate specific antigen [PSA]: Secondary | ICD-10-CM

## 2024-07-27 ENCOUNTER — Other Ambulatory Visit: Payer: Self-pay | Admitting: Family Medicine

## 2024-07-27 DIAGNOSIS — R972 Elevated prostate specific antigen [PSA]: Secondary | ICD-10-CM

## 2024-09-01 ENCOUNTER — Encounter: Payer: Self-pay | Admitting: Family Medicine

## 2024-09-04 ENCOUNTER — Other Ambulatory Visit

## 2024-09-04 DIAGNOSIS — R972 Elevated prostate specific antigen [PSA]: Secondary | ICD-10-CM

## 2024-09-05 LAB — PSA: Prostate Specific Ag, Serum: 6.1 ng/mL — ABNORMAL HIGH (ref 0.0–4.0)

## 2024-09-07 ENCOUNTER — Encounter: Payer: Self-pay | Admitting: Family Medicine

## 2024-10-08 ENCOUNTER — Other Ambulatory Visit: Payer: Self-pay | Admitting: Urology

## 2024-10-08 DIAGNOSIS — R972 Elevated prostate specific antigen [PSA]: Secondary | ICD-10-CM

## 2024-10-25 ENCOUNTER — Encounter: Payer: Self-pay | Admitting: Family Medicine

## 2024-11-14 ENCOUNTER — Ambulatory Visit
Admission: RE | Admit: 2024-11-14 | Discharge: 2024-11-14 | Disposition: A | Source: Ambulatory Visit | Attending: Urology

## 2024-11-14 DIAGNOSIS — R972 Elevated prostate specific antigen [PSA]: Secondary | ICD-10-CM

## 2024-11-14 MED ORDER — GADOPICLENOL 0.5 MMOL/ML IV SOLN
9.0000 mL | Freq: Once | INTRAVENOUS | Status: AC | PRN
  Administered 2024-11-14: 9 mL via INTRAVENOUS

## 2024-11-17 ENCOUNTER — Encounter: Payer: Self-pay | Admitting: Family Medicine
